# Patient Record
Sex: Female | Born: 1940 | Race: White | Hispanic: No | Smoking: Never smoker
Health system: Southern US, Community
[De-identification: ages and names within clinical notes are randomized; demographics above are authoritative.]

## PROBLEM LIST (undated history)

## (undated) DIAGNOSIS — K219 Gastro-esophageal reflux disease without esophagitis: Secondary | ICD-10-CM

## (undated) DIAGNOSIS — M545 Low back pain, unspecified: Secondary | ICD-10-CM

## (undated) DIAGNOSIS — G8929 Other chronic pain: Secondary | ICD-10-CM

## (undated) DIAGNOSIS — D509 Iron deficiency anemia, unspecified: Secondary | ICD-10-CM

## (undated) DIAGNOSIS — F419 Anxiety disorder, unspecified: Secondary | ICD-10-CM

## (undated) DIAGNOSIS — M1990 Unspecified osteoarthritis, unspecified site: Secondary | ICD-10-CM

## (undated) DIAGNOSIS — I219 Acute myocardial infarction, unspecified: Secondary | ICD-10-CM

## (undated) DIAGNOSIS — K8020 Calculus of gallbladder without cholecystitis without obstruction: Secondary | ICD-10-CM

## (undated) DIAGNOSIS — E119 Type 2 diabetes mellitus without complications: Secondary | ICD-10-CM

## (undated) DIAGNOSIS — N189 Chronic kidney disease, unspecified: Secondary | ICD-10-CM

## (undated) DIAGNOSIS — C44320 Squamous cell carcinoma of skin of unspecified parts of face: Secondary | ICD-10-CM

## (undated) DIAGNOSIS — K7460 Unspecified cirrhosis of liver: Secondary | ICD-10-CM

## (undated) DIAGNOSIS — F32A Depression, unspecified: Secondary | ICD-10-CM

## (undated) DIAGNOSIS — I10 Essential (primary) hypertension: Secondary | ICD-10-CM

## (undated) DIAGNOSIS — J189 Pneumonia, unspecified organism: Secondary | ICD-10-CM

## (undated) DIAGNOSIS — N83201 Unspecified ovarian cyst, right side: Secondary | ICD-10-CM

## (undated) DIAGNOSIS — F329 Major depressive disorder, single episode, unspecified: Secondary | ICD-10-CM

## (undated) DIAGNOSIS — D649 Anemia, unspecified: Secondary | ICD-10-CM

## (undated) DIAGNOSIS — E785 Hyperlipidemia, unspecified: Secondary | ICD-10-CM

## (undated) HISTORY — PX: TUMOR EXCISION: SHX421

## (undated) HISTORY — PX: CATARACT EXTRACTION, BILATERAL: SHX1313

## (undated) HISTORY — DX: Squamous cell carcinoma of skin of unspecified parts of face: C44.320

## (undated) HISTORY — DX: Anemia, unspecified: D64.9

## (undated) HISTORY — PX: FRACTURE SURGERY: SHX138

## (undated) HISTORY — PX: TONSILLECTOMY: SUR1361

## (undated) HISTORY — PX: ESOPHAGOGASTRODUODENOSCOPY: SHX1529

## (undated) HISTORY — PX: COLONOSCOPY: SHX174

## (undated) HISTORY — DX: Unspecified ovarian cyst, right side: N83.201

## (undated) HISTORY — PX: BUNIONECTOMY WITH HAMMERTOE RECONSTRUCTION: SHX5600

## (undated) HISTORY — DX: Unspecified cirrhosis of liver: K74.60

## (undated) HISTORY — PX: DILATION AND CURETTAGE OF UTERUS: SHX78

## (undated) HISTORY — PX: EYE SURGERY: SHX253

## (undated) HISTORY — DX: Calculus of gallbladder without cholecystitis without obstruction: K80.20

---

## 2011-08-12 ENCOUNTER — Encounter: Payer: Self-pay | Admitting: *Deleted

## 2011-08-12 ENCOUNTER — Emergency Department (INDEPENDENT_AMBULATORY_CARE_PROVIDER_SITE_OTHER)
Admission: EM | Admit: 2011-08-12 | Discharge: 2011-08-12 | Disposition: A | Discharge status: Home / Self Care | Payer: Medicare Other | Source: Home / Self Care | Attending: Emergency Medicine | Admitting: Emergency Medicine

## 2011-08-12 DIAGNOSIS — R05 Cough: Secondary | ICD-10-CM

## 2011-08-12 DIAGNOSIS — J209 Acute bronchitis, unspecified: Secondary | ICD-10-CM

## 2011-08-12 DIAGNOSIS — R059 Cough, unspecified: Secondary | ICD-10-CM

## 2011-08-12 HISTORY — DX: Hyperlipidemia, unspecified: E78.5

## 2011-08-12 HISTORY — DX: Essential (primary) hypertension: I10

## 2011-08-12 MED ORDER — AZITHROMYCIN 250 MG PO TABS: ORAL_TABLET | ORAL | Status: AC

## 2011-08-12 MED ORDER — METHYLPREDNISOLONE ACETATE PF 40 MG/ML IJ SUSP
40.0000 mg | Freq: Once | INTRAMUSCULAR | Status: AC
  Administered 2011-08-12: 40 mg via INTRAMUSCULAR

## 2011-08-12 MED ORDER — GUAIFENESIN-CODEINE 100-10 MG/5ML PO SYRP: 5.0000 mL | ORAL_SOLUTION | Freq: Four times a day (QID) | ORAL | Status: AC | PRN

## 2011-08-12 NOTE — ED Provider Notes (Signed)
History     CSN: [id number]  Arrival date & time 08/12/11  1334   First MD Initiated Contact with Patient 08/12/11 1353      Chief Complaint  Patient presents with  . Sore Throat  . Cough    (Consider location/radiation/quality/duration/timing/severity/associated sxs/prior treatment) HPI Amber Gentry is a 71 y.o. female who complains of onset of cold symptoms for 3-4 days.  No sore throat + cough No pleuritic pain + wheezing + nasal congestion + post-nasal drainage No sinus pain/pressure + chest congestion No itchy/red eyes No earache No hemoptysis No SOB No chills/sweats No fever No nausea No vomiting No abdominal pain No diarrhea No skin rashes No fatigue No myalgias No headache    No past medical history on file.  No past surgical history on file.  No family history on file.  History  Substance Use Topics  . Smoking status: Not on file  . Smokeless tobacco: Not on file  . Alcohol Use: Not on file    OB History    No data available      Review of Systems  Allergies  Review of patient's allergies indicates no known allergies.  Home Medications   Current Outpatient Rx  Name Route Sig Dispense Refill  . AZITHROMYCIN 250 MG PO TABS  Use as directed 1 each 0  . GUAIFENESIN-CODEINE 100-10 MG/5ML PO SYRP Oral Take 5 mLs by mouth 4 (four) times daily as needed for cough or congestion. 120 mL 0    BP 139/77  Pulse 72  Temp(Src) 98.2 F (36.8 C) (Oral)  Resp 18  Ht 5' 5.5" (1.664 m)  Wt 173 lb 4 oz (78.586 kg)  BMI 28.39 kg/m2  SpO2 95%  Physical Exam  Nursing note and vitals reviewed. Constitutional: She is oriented to person, place, and time. She appears well-developed and well-nourished.  HENT:  Head: Normocephalic and atraumatic.  Nose: Rhinorrhea present.  Mouth/Throat: No oropharyngeal exudate, posterior oropharyngeal edema or posterior oropharyngeal erythema.       Cerumen bilaterally, clear postnasal drip in her oropharynx  Eyes: No  scleral icterus.  Neck: Neck supple.  Cardiovascular: Regular rhythm and normal heart sounds.   Pulmonary/Chest: Effort normal and breath sounds normal. No respiratory distress.  Neurological: She is alert and oriented to person, place, and time.  Skin: Skin is warm and dry.  Psychiatric: She has a normal mood and affect. Her speech is normal.    ED Course  Procedures (including critical care time)  Labs Reviewed - No data to display No results found.   1. Acute bronchitis   2. Cough       MDM  1)  Take the prescribed antibiotic as instructed.  The patient requested to have a shot to make her feel better quickly, so I agreed in gave her a shot of Depo-Medrol 40 mg today. I also put her on a Z-Pak and gave her cough medicine. I think this is still likely viral, however with her history of pneumonia rather treat her now. If necessary her primary care doctor can see her later this week to make sure she is getting better. 2)  Use nasal saline solution (over the counter) at least 3 times a day. 3)  Use over the counter decongestants like Zyrtec-D every 12 hours as needed to help with congestion.  If you have hypertension, do not take medicines with sudafed.  4)  Can take tylenol every 6 hours or motrin every 8 hours for pain or fever. 5)  Follow up with your primary doctor if no improvement in 5-7 days, sooner if increasing pain, fever, or new symptoms.        Lily Kocher, MD 08/12/11 [phone]

## 2011-08-12 NOTE — ED Notes (Signed)
Pt c/o sore throat, productive cough and and chest congestion x 1 1/2 wk. She has hx of pneumonia.

## 2013-04-11 ENCOUNTER — Emergency Department (INDEPENDENT_AMBULATORY_CARE_PROVIDER_SITE_OTHER)
Admission: EM | Admit: 2013-04-11 | Discharge: 2013-04-11 | Disposition: A | Discharge status: Home / Self Care | Payer: Medicare Other | Source: Home / Self Care | Attending: Family Medicine | Admitting: Family Medicine

## 2013-04-11 ENCOUNTER — Other Ambulatory Visit: Payer: Self-pay | Admitting: Family Medicine

## 2013-04-11 ENCOUNTER — Encounter: Payer: Self-pay | Admitting: *Deleted

## 2013-04-11 DIAGNOSIS — R062 Wheezing: Secondary | ICD-10-CM

## 2013-04-11 DIAGNOSIS — J40 Bronchitis, not specified as acute or chronic: Secondary | ICD-10-CM

## 2013-04-11 DIAGNOSIS — J069 Acute upper respiratory infection, unspecified: Secondary | ICD-10-CM

## 2013-04-11 LAB — POCT CBC W AUTO DIFF (K'VILLE URGENT CARE)

## 2013-04-11 MED ORDER — METHYLPREDNISOLONE ACETATE 80 MG/ML IJ SUSP
80.0000 mg | Freq: Once | INTRAMUSCULAR | Status: AC
  Administered 2013-04-11: 80 mg via INTRAMUSCULAR

## 2013-04-11 MED ORDER — AZITHROMYCIN 250 MG PO TABS: ORAL_TABLET | ORAL | Status: DC

## 2013-04-11 MED ORDER — HYDROCOD POLST-CHLORPHEN POLST 10-8 MG/5ML PO LQCR: 5.0000 mL | Freq: Two times a day (BID) | ORAL | Status: DC | PRN

## 2013-04-11 NOTE — ED Notes (Signed)
Patient c/o body aches, cough, and nasal congestion x 3 days. Patient has tried OTC Tylenol no relief.

## 2013-04-11 NOTE — ED Provider Notes (Signed)
CSN: [id number]     Arrival date & time 04/11/13  1102 History   First MD Initiated Contact with Patient 04/11/13 1104     Chief Complaint  Patient presents with  . Fever  . Generalized Body Aches  . Nasal Congestion    HPI  URI Symptoms Onset: 3-4 days  Description: rhinorrhea, nasal congestion, cough, wheezing  Modifying factors:  none  Symptoms Nasal discharge: yes Fever: subjective at home, not measured Sore throat: yes Cough: yes Wheezing: yes Ear pain: no GI symptoms: no Sick contacts: no  Red Flags  Stiff neck: no Dyspnea: no Rash: no Swallowing difficulty: no  Sinusitis Risk Factors Headache/face pain: no Double sickening: no tooth pain: no  Allergy Risk Factors Sneezing: no Itchy scratchy throat: no Seasonal symptoms: no  Flu Risk Factors Headache: no muscle aches: yes severe fatigue: mild   Past Medical History  Diagnosis Date  . Hypertension   . Diabetes mellitus   . Hyperlipemia    Past Surgical History  Procedure Laterality Date  . Bunionectomy    . 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 Hand surgery      RT    Family History  Problem Relation Age of Onset  . Diabetes Mother    History  Substance Use Topics  . Smoking status: Never Smoker   . Smokeless tobacco: Not on file  . Alcohol Use: No   OB History   Grav Para Term Preterm Abortions TAB SAB Ect Mult Living                 Review of Systems  All other systems reviewed and are negative.    Allergies  Review of patient's allergies indicates no known allergies.  Home Medications   Current Outpatient Rx  Name  Route  Sig  Dispense  Refill  . ALPRAZolam (XANAX) 0.25 MG tablet   Oral   Take 0.25 mg by mouth at bedtime as needed.           . magnesium 30 MG tablet   Oral   Take 30 mg by mouth 2 (two) times daily.           . metFORMIN (GLUCOPHAGE) 1000 MG tablet   Oral   Take 1,000 mg by mouth 2 (two) times daily with a meal.           . metoprolol  (TOPROL-XL) 100 MG 24 hr tablet   Oral   Take 100 mg by mouth daily.           Marland Kitchen omeprazole (PRILOSEC) 20 MG capsule   Oral   Take 20 mg by mouth daily.           . sertraline (ZOLOFT) 100 MG tablet   Oral   Take 100 mg by mouth daily.            There were no vitals taken for this visit. Physical Exam  Constitutional: She appears well-developed and well-nourished.  HENT:  Head: Normocephalic and atraumatic.  Right Ear: External ear normal.  Left Ear: External ear normal.  Mouth/Throat: No oropharyngeal exudate.  +nasal erythema, rhinorrhea bilaterally, + post oropharyngeal erythema    Eyes: Conjunctivae are normal. Pupils are equal, round, and reactive to light.  Neck: Normal range of motion. Neck supple.  Cardiovascular: Normal rate, regular rhythm and normal heart sounds.   Pulmonary/Chest: Effort normal.  Good overall respiratory effort overall clear to auscultation with  faint wheezes in apices.  Abdominal: Soft.  Musculoskeletal: Normal range of motion.  Lymphadenopathy:    She has cervical adenopathy.  Neurological: She is alert.  Skin: Skin is warm.    ED Course  Procedures (including critical care time) Labs Review Labs Reviewed - No data to display Imaging Review No results found.  MDM   1. URI (upper respiratory infection)   2. Bronchitis    Suspect likely viral illness.  Depo-Medrol 80 mg IM x1 for wheezing. Z-Pak for atypical coverage given age and comorbidities. Tussionex for cough. We'll check baseline serologies including flu, RMSF, Lyme. Discuss infectious and respiratory red flags at length with patient. Followup as needed.      The patient and/or caregiver has been counseled thoroughly with regard to treatment plan and/or medications prescribed including dosage, schedule, interactions, rationale for use, and possible side effects and they verbalize understanding. Diagnoses and expected course of recovery discussed and will return if  not improved as expected or if the condition worsens. Patient and/or caregiver verbalized understanding.         Doree Albee, MD 04/11/13 1139

## 2013-04-13 ENCOUNTER — Telehealth: Payer: Self-pay | Admitting: *Deleted

## 2013-04-13 LAB — ROCKY MTN SPOTTED FVR AB, IGG-BLOOD: RMSF IgG: 0.29 IV

## 2013-04-13 NOTE — ED Notes (Unsigned)
[facility] called they were unable to run Influenza PCR test due to the wrong tube was sent to the lab. Do you want to repeat the test or see if the pt has improved first? Per Dr. Alvester Morin:It would be good to have, but if pt is feeling better, no need to repeat.  Called left message to call back to check pts status.

## 2013-04-14 LAB — BORDETELLA PERTUSSIS PCR: B pertussis, DNA: NOT DETECTED

## 2013-04-15 ENCOUNTER — Telehealth: Payer: Self-pay | Admitting: *Deleted

## 2013-04-29 LAB — CULTURE, BORDETELLA PERTUSSIS

## 2013-05-03 ENCOUNTER — Telehealth: Payer: Self-pay | Admitting: *Deleted

## 2014-06-03 ENCOUNTER — Encounter: Payer: Self-pay | Admitting: Emergency Medicine

## 2014-06-03 ENCOUNTER — Emergency Department (INDEPENDENT_AMBULATORY_CARE_PROVIDER_SITE_OTHER)
Admission: EM | Admit: 2014-06-03 | Discharge: 2014-06-03 | Disposition: A | Discharge status: Home / Self Care | Payer: Medicare Other | Source: Home / Self Care | Attending: Emergency Medicine | Admitting: Emergency Medicine

## 2014-06-03 DIAGNOSIS — J0100 Acute maxillary sinusitis, unspecified: Secondary | ICD-10-CM

## 2014-06-03 DIAGNOSIS — J209 Acute bronchitis, unspecified: Secondary | ICD-10-CM

## 2014-06-03 HISTORY — DX: Depression, unspecified: F32.A

## 2014-06-03 HISTORY — DX: Anxiety disorder, unspecified: F41.9

## 2014-06-03 HISTORY — DX: Major depressive disorder, single episode, unspecified: F32.9

## 2014-06-03 MED ORDER — AZITHROMYCIN 250 MG PO TABS: ORAL_TABLET | ORAL | Status: DC

## 2014-06-03 MED ORDER — HYDROCOD POLST-CHLORPHEN POLST 10-8 MG/5ML PO LQCR: 5.0000 mL | Freq: Every evening | ORAL | Status: DC | PRN

## 2014-06-03 MED ORDER — CEFTRIAXONE SODIUM 250 MG IJ SOLR
500.0000 mg | Freq: Once | INTRAMUSCULAR | Status: AC
  Administered 2014-06-03: 500 mg via INTRAMUSCULAR

## 2014-06-03 NOTE — ED Notes (Signed)
Reports 2-3 day history of rhinitis, cough, headache, ear pain, facial pain over sinuses and sore throat. No OTCs since last night. Did have Flu vaccination.

## 2014-06-03 NOTE — ED Provider Notes (Signed)
CSN: [id number]     Arrival date & time 06/03/14  1005 History   First MD Initiated Contact with Patient 06/03/14 1049     Chief Complaint  Patient presents with  . Cough  . Headache  . Otalgia  . Nasal Congestion   (Consider location/radiation/quality/duration/timing/severity/associated sxs/prior Treatment) HPI URI HISTORY  Amber Gentry is a 73 y.o. female who complains of onset of cold symptoms for 4 days. Progressively worsening. Have been using over-the-counter treatment which helps a little bit.  No chills/sweats +  Fever  +  Nasal congestion +  Discolored Post-nasal drainage Positive sinus pain/pressure Positive sore throat  +  cough No wheezing Mild chest congestion No hemoptysis No shortness of breath No pleuritic pain  No itchy/red eyes No earache  No nausea No vomiting No abdominal pain No diarrhea  No skin rashes +  Fatigue No myalgias No headache   Past Medical History  Diagnosis Date  . Hypertension   . Diabetes mellitus   . Hyperlipemia   . Anxiety and depression    Past Surgical History  Procedure Laterality Date  . Bunionectomy    . 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 Hand surgery      RT    Family History  Problem Relation Age of Onset  . Diabetes Mother    History  Substance Use Topics  . Smoking status: Never Smoker   . Smokeless tobacco: Not on file  . Alcohol Use: No   OB History   Grav Para Term Preterm Abortions TAB SAB Ect Mult Living                 Review of Systems  All other systems reviewed and are negative.   Allergies  Review of patient's allergies indicates no known allergies.  Home Medications   Prior to Admission medications   Medication Sig Start Date End Date Taking? Authorizing Provider  ALPRAZolam (XANAX) 0.25 MG tablet Take 0.25 mg by mouth at bedtime as needed.      Historical Provider, MD  azithromycin (ZITHROMAX Z-PAK) 250 MG tablet Take 2 tablets on day one, then 1 tablet daily on days 2  through 5 06/03/14   Jacqulyn Cane, MD  chlorpheniramine-HYDROcodone [facility]) 10-8 MG/5ML [facility] Take 5 mLs by mouth at bedtime as needed for cough. For cough. 06/03/14   Jacqulyn Cane, MD  magnesium 30 MG tablet Take 30 mg by mouth 2 (two) times daily.      Historical Provider, MD  metFORMIN (GLUCOPHAGE) 1000 MG tablet Take 1,000 mg by mouth 2 (two) times daily with a meal.      Historical Provider, MD  metoprolol (TOPROL-XL) 100 MG 24 hr tablet Take 100 mg by mouth daily.      Historical Provider, MD  omeprazole (PRILOSEC) 20 MG capsule Take 20 mg by mouth daily.      Historical Provider, MD  sertraline (ZOLOFT) 100 MG tablet Take 100 mg by mouth daily.      Historical Provider, MD   BP 132/78  Pulse 92  Temp(Src) 97.9 F (36.6 C) (Oral)  Resp 16  Ht 5\' 5"  (1.651 m)  Wt 170 lb (77.111 kg)  BMI 28.29 kg/m2  SpO2 96% Physical Exam  Nursing note and vitals reviewed. Constitutional: She is oriented to person, place, and time. She appears well-developed and well-nourished. No distress.  HENT:  Head: Normocephalic and atraumatic.  Right Ear: Tympanic membrane, external ear and  ear canal normal.  Left Ear: Tympanic membrane, external ear and ear canal normal.  Nose: Mucosal edema and rhinorrhea present. Right sinus exhibits maxillary sinus tenderness. Left sinus exhibits maxillary sinus tenderness.  Mouth/Throat: Oropharynx is clear and moist. No oral lesions. No oropharyngeal exudate.  Eyes: Right eye exhibits no discharge. Left eye exhibits no discharge. No scleral icterus.  Neck: Neck supple.  Cardiovascular: Normal rate, regular rhythm and normal heart sounds.   Pulmonary/Chest: Effort normal. She has no wheezes. She has rhonchi. She has no rales.  Lymphadenopathy:    She has no cervical adenopathy.  Neurological: She is alert and oriented to person, place, and time.  Skin: Skin is warm and dry.  Psychiatric: She has a normal mood and affect.    ED Course   Procedures (including critical care time) Labs Review Labs Reviewed - No data to display  Imaging Review No results found.   MDM   1. Acute maxillary sinusitis, recurrence not specified   2. Acute bronchitis, unspecified organism    Treatment options discussed, as well as risks, benefits, alternatives. Patient voiced understanding and agreement with the following plans:  She requests a shot, Rocephin 500 IM stat given. Z-Pak Tussionex, 1 teaspoon at bedtime if needed. Precautions discussed. Other symptomatic care discussed Follow-up with your primary care doctor in 5-7 days if not improving, or sooner if symptoms become worse. Precautions discussed. Red flags discussed. Questions invited and answered. Patient voiced understanding and agreement.     Jacqulyn Cane, MD 06/03/14 [phone]

## 2015-08-06 HISTORY — PX: PERCUTANEOUS PINNING PHALANX FRACTURE OF HAND: SUR1027

## 2016-06-19 ENCOUNTER — Emergency Department (INDEPENDENT_AMBULATORY_CARE_PROVIDER_SITE_OTHER)
Admission: EM | Admit: 2016-06-19 | Discharge: 2016-06-19 | Disposition: A | Discharge status: Home / Self Care | Payer: Medicare Other | Source: Home / Self Care | Attending: Family Medicine | Admitting: Family Medicine

## 2016-06-19 ENCOUNTER — Ambulatory Visit (INDEPENDENT_AMBULATORY_CARE_PROVIDER_SITE_OTHER): Payer: Medicare Other | Admitting: Family Medicine

## 2016-06-19 ENCOUNTER — Encounter: Payer: Self-pay | Admitting: *Deleted

## 2016-06-19 DIAGNOSIS — M7062 Trochanteric bursitis, left hip: Secondary | ICD-10-CM | POA: Diagnosis not present

## 2016-06-19 DIAGNOSIS — G8929 Other chronic pain: Secondary | ICD-10-CM

## 2016-06-19 DIAGNOSIS — M7060 Trochanteric bursitis, unspecified hip: Secondary | ICD-10-CM | POA: Insufficient documentation

## 2016-06-19 DIAGNOSIS — M545 Low back pain, unspecified: Secondary | ICD-10-CM

## 2016-06-19 DIAGNOSIS — S39012A Strain of muscle, fascia and tendon of lower back, initial encounter: Secondary | ICD-10-CM | POA: Diagnosis not present

## 2016-06-19 DIAGNOSIS — M7061 Trochanteric bursitis, right hip: Secondary | ICD-10-CM

## 2016-06-19 MED ORDER — DICLOFENAC SODIUM 1 % TD GEL: 4.0000 g | Freq: Four times a day (QID) | TRANSDERMAL | 11 refills | Status: DC

## 2016-06-19 NOTE — ED Triage Notes (Signed)
Pt c/o LT side back pain radiating to her LT buttock. She reports a fall in 5/17' with normal xrays. She received an injection of Toradol 30mg  at her PCP in 9/17' with relief. She reports a hx of arthritis. She takes mobic daily.

## 2016-06-19 NOTE — Progress Notes (Signed)
Amber Gentry is a 75 y.o. female who presents to [facility] today for back pain. Patient notes a several day history of left low back pain. She fell several months ago and has had worsening pain recently. She notes pain in the left low back and left lateral hip. Pain is worse with ambulation and lying on her left side. She cannot take tramadol as it interferes with her Xanax or Tylenol as it causes transaminitis or ibuprofen which interferes with her blood pressure and diabetes. She has tried using warm water soaks which do help. She denies any radiating pain weakness or numbness fevers or chills.    Past Medical History:  Diagnosis Date  . Anxiety and depression   . Diabetes mellitus   . Hyperlipemia   . Hypertension    Past Surgical History:  Procedure Laterality Date  . BACK SURGERY    . BUNIONECTOMY    . DILATION AND CURETTAGE OF UTERUS    . HAND SURGERY     RT   . tumor on back     . 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 Smoking status: Never Smoker  . Smokeless tobacco: Never Used  . Alcohol use No   family history includes Diabetes in her mother; Heart disease in her brother and mother; Stroke in her brother.  ROS:  No headache, visual changes, nausea, vomiting, diarrhea, constipation, dizziness, abdominal pain, skin rash, fevers, chills, night sweats, weight loss, swollen lymph nodes, body aches, joint swelling, muscle aches, chest pain, shortness of breath, mood changes, visual or auditory hallucinations.    Medications: Current Outpatient Prescriptions  Medication Sig Dispense Refill  . ALPRAZolam (XANAX) 0.25 MG tablet Take 0.25 mg by mouth at bedtime as needed.      Marland Kitchen aspirin 81 MG chewable tablet Chew by mouth daily.    . diclofenac sodium (VOLTAREN) 1 % GEL Apply 4 g topically 4 (four) times daily. To affected joint. 100 g 11  . magnesium 30 MG tablet Take 30 mg by mouth 2 (two) times daily.       . meloxicam (MOBIC) 7.5 MG tablet Take 7.5 mg by mouth daily.    . metFORMIN (GLUCOPHAGE) 1000 MG tablet Take 1,000 mg by mouth 2 (two) times daily with a meal.      . metoprolol (TOPROL-XL) 100 MG 24 hr tablet Take 100 mg by mouth daily.      Marland Kitchen omeprazole (PRILOSEC) 20 MG capsule Take 20 mg by mouth daily.      . sertraline (ZOLOFT) 100 MG tablet Take 100 mg by mouth daily.      . simvastatin (ZOCOR) 10 MG tablet Take 10 mg by mouth daily.     No current facility-administered medications for this visit.    Allergies  Allergen Reactions  . Percocet [Oxycodone-Acetaminophen]      Exam:  BP 136/82 (BP Location: Left Arm)   Pulse 70   Temp 98 F (36.7 C) (Oral)   Resp 16   Ht 5' 5.5" (1.664 m)   Wt 175 lb (79.4 kg)   SpO2 97%   BMI 28.68 kg/m  General: Well Developed, well nourished, and in no acute distress.  Neuro/Psych: Alert and oriented x3, extra-ocular muscles intact, able to move all 4 extremities, sensation grossly intact. Skin: Warm and dry, no rashes noted.  Respiratory: Not using accessory muscles, speaking in full sentences, trachea midline.  Cardiovascular: Pulses palpable,  no extremity edema. Abdomen: Does not appear distended. MSK: L spine: Nontender to midline. Tender palpation left lumbar paraspinal muscles and SI joint. Normal back motion. Left hip normal-appearing normal motion tenderness palpation left lateral greater trochanter area. Antalgic gait.  XR Lumbar Spine Ap And Lateral5/25/2017 [facility] Result Impression  IMPRESSION: 1. Multilevel degenerative changes as noted above without evidence of acute fracture or dislocation. Mild levoscoliosis of the lumbar spine.  Result Narrative  INDICATION: Fall, low back pain  COMPARISON: None  TECHNIQUE: AP and lateral views of the lumbar spine and a cone-down view of L5/S1 on 3 films  FINDINGS: No evidence of acute fracture or dislocation. Mild levoscoliosis. Vertebral body heights are preserved.  Severe disc space narrowing at L1/L2, L2/L3, L4/L5, and L5/S1. 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 Atherosclerotic  aorta.     No results found for this or any previous visit (from the past 48 hour(s)). No results found.    Assessment and Plan: 75 y.o. female with left low back pain and lateral hip pain. Likely due to lumbosacral strain and trochanteric bursitis. X-rays reviewed showing no fractures. 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 Plan for physical therapy and follow-up in sports medicine clinic in a few weeks. Treat pain with diclofenac gel topically as patient cannot tolerate oral NSAIDs acetaminophen or opiates.     Orders Placed This Encounter  Procedures  . Ambulatory referral to Physical Therapy    Referral Priority:   Routine    Referral Type:   Physical Medicine    Referral Reason:   Specialty Services Required    Requested Specialty:   Physical Therapy    Number of Visits Requested:   1    Discussed warning signs or symptoms. Please see discharge instructions. Patient expresses understanding.

## 2016-06-19 NOTE — Patient Instructions (Signed)
Thank you for coming in today. Use voltaren gel as needed for pain.  Attend PT.  Return to recheck with me in 2-4 weeks.  Follow up with me Dr [facility] Address: [address] [place], [place] [address] Phone: [phone]   Lumbosacral Strain Lumbosacral strain is an injury that causes pain in the lower back (lumbosacral spine). This injury usually occurs from overstretching the muscles or ligaments along your spine. A strain can affect one or more muscles or cord-like tissues that connect bones to other bones (ligaments). What are the causes? This condition may be caused by:  A hard, direct hit (blow) to the back.  Excessive stretching of the lower back muscles. This may result from:  A fall.  Lifting something heavy.  Repetitive movements such as bending or crouching. What increases the risk? The following factors may increase your risk of getting this condition:  Participating in sports or activities that involve:  A sudden twist of the back.  Pushing or pulling motions.  Being overweight or obese.  Having poor strength and flexibility, especially tight hamstrings or weak muscles in the back or abdomen.  Having too much of a curve in the lower back.  Having a pelvis that is tilted forward. What are the signs or symptoms? The main symptom of this condition is pain in the lower back, at the site of the strain. Pain may extend (radiate) down one or both legs. How is this diagnosed? This condition is diagnosed based on:  Your symptoms.  Your medical history.  A physical exam.  Your health care provider may push on certain areas of your back to determine the source of your pain.  You may be asked to bend forward, backward, and side to side to assess the severity of your pain and your range of motion.  Imaging tests, such as:  X-rays.  MRI. How is this treated? Treatment for this condition may include:  Putting heat and cold on  the affected area.  Medicines to help relieve pain and relax your muscles (muscle relaxants).  NSAIDs to help reduce swelling and discomfort. When your symptoms improve, it is important to gradually return to your normal routine as soon as possible to reduce pain, avoid stiffness, and avoid loss of muscle strength. Generally, symptoms should improve within 6 weeks of treatment. However, recovery time varies. Follow these instructions at home: Managing pain, stiffness, and swelling  If directed, put ice on the injured area during the first 24 hours after your strain.  Put ice in a plastic bag.  Place a towel between your skin and the bag.  Leave the ice on for 20 minutes, 2-3 times a day.  If directed, put heat on the affected area as often as told by your health care provider. Use the heat source that your health care provider recommends, such as a moist heat pack or a heating pad.  Place a towel between your skin and the heat source.  Leave the heat on for 20-30 minutes.  Remove the heat if your skin turns bright red. This is especially important if you are unable to feel pain, heat, or cold. You may have a greater risk of getting burned. Activity  Rest and return to your normal activities as told by your health care provider. Ask your health care provider what activities are safe for you.  Avoid activities that take a lot of energy for as long as told by your health care provider.  General instructions  Take over-the-counter and prescription medicines only as told by your health care provider.  Donot drive or use heavy machinery while taking prescription pain medicine.  Do not use any products that contain nicotine or tobacco, such as cigarettes and e-cigarettes. If you need help quitting, ask your health care provider.  Keep all follow-up visits as told by your health care provider. This is important. How is this prevented?  Use correct form when playing sports and lifting  heavy objects.  Use good posture when sitting and standing.  Maintain a healthy weight.  Sleep on a mattress with medium firmness to support your back.  Be safe and responsible while being active to avoid falls.  Do at least 150 minutes of moderate-intensity exercise each week, such as brisk walking or water aerobics. Try a form of exercise that takes stress off your back, such as swimming or stationary cycling.  Maintain physical fitness, including:  Strength.  Flexibility.  Cardiovascular fitness.  Endurance. Contact a health care provider if:  Your back pain does not improve after 6 weeks of treatment.  Your symptoms get worse. Get help right away if:  Your back pain is severe.  You cannot stand or walk.  You have difficulty controlling when you urinate or when you have a bowel movement.  You feel nauseous or you vomit.  Your feet get very cold.  You have numbness, tingling, weakness, or problems using your arms or legs.  You develop any of the following:  Shortness of breath.  Dizziness.  Pain in your legs.  Weakness in your buttocks or legs.  Discoloration of the skin on your toes or legs. This information is not intended to replace advice given to you by your health care provider. Make sure you discuss any questions you have with your health care provider. Document Released: 05/01/2005 Document Revised: 02/09/2016 Document Reviewed: 12/24/2015 Elsevier Interactive Patient Education  2017 [employer].

## 2016-06-19 NOTE — ED Provider Notes (Signed)
Amber Gentry CARE    CSN: [id number] Arrival date & time: 06/19/16  1344     History   Chief Complaint Chief Complaint  Patient presents with  . Back Pain    HPI Amber Gentry is a 75 y.o. female.   Patient complains of 3 to 4 day history of lower back pain, primarily on the left, radiating to her left buttock.  She denies recent trauma or changes in daily activities. She had fallen in May, 2017, and was evaluated at [facility] ED where x-ray of her lumbar spine showed no acute changes.  She also had negative x-rays of her thoracic spine and negative CT head.  Her pain is worse with ambulation and supine on her left side.   She denies bowel or bladder dysfunction, and no saddle numbness.  She is unable to take Tramadol, Tylenol, or ibuprofen.  Her pain does not respond to warm soaks.        Back Pain  Location:  Lumbar spine and sacro-iliac joint Quality:  Aching Radiates to: left buttock. Pain severity:  Moderate Pain is:  Same all the time Onset quality:  Gradual Duration:  4 days Timing:  Constant Progression:  Unchanged Chronicity:  Recurrent Context comment:  Previous fall Relieved by:  Nothing Worsened by:  Ambulation Ineffective treatments: warm soaks. Associated symptoms: no abdominal pain, no bladder incontinence, no bowel incontinence, no chest pain, no dysuria, no fever, no leg pain, no numbness, no paresthesias, no pelvic pain, no perianal numbness, no tingling, no weakness and no weight loss     Past Medical History:  Diagnosis Date  . Anxiety and depression   . Diabetes mellitus   . Hyperlipemia   . Hypertension     Patient Active Problem List   Diagnosis Date Noted  . Lumbosacral strain 06/19/2016  . Trochanteric bursitis 06/19/2016    Past Surgical History:  Procedure Laterality Date  . BACK SURGERY    . BUNIONECTOMY    . DILATION AND CURETTAGE OF UTERUS    . HAND SURGERY     RT   . tumor on back     . 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 Authorizing Provider  ALPRAZolam (XANAX) 0.25 MG tablet Take 0.25 mg by mouth at bedtime as needed.     Yes Historical Provider, MD  aspirin 81 MG chewable tablet Chew by mouth daily.   Yes Historical Provider, MD  meloxicam (MOBIC) 7.5 MG tablet Take 7.5 mg by mouth daily.   Yes Historical Provider, MD  metFORMIN (GLUCOPHAGE) 1000 MG tablet Take 1,000 mg by mouth 2 (two) times daily with a meal.     Yes Historical Provider, MD  metoprolol (TOPROL-XL) 100 MG 24 hr tablet Take 100 mg by mouth daily.     Yes Historical Provider, MD  sertraline (ZOLOFT) 100 MG tablet Take 100 mg by mouth daily.     Yes Historical Provider, MD  simvastatin (ZOCOR) 10 MG tablet Take 10 mg by mouth daily.   Yes Historical Provider, MD  diclofenac sodium (VOLTAREN) 1 % GEL Apply 4 g topically 4 (four) times daily. To affected joint. 06/19/16   Gregor Hams, MD  magnesium 30 MG tablet Take 30 mg by mouth 2 (two) times daily.  Historical Provider, MD  omeprazole (PRILOSEC) 20 MG capsule Take 20 mg by mouth daily.      Historical Provider, MD    Family History Family History  Problem Relation Age of Onset  . Diabetes Mother   . Heart disease Mother   . Heart disease Brother   . Stroke Brother     Social History Social History  Substance Use Topics  . Smoking status: Never Smoker  . Smokeless tobacco: Never Used  . Alcohol use No     Allergies   Percocet [oxycodone-acetaminophen]   Review of Systems Review of Systems  Constitutional: Negative for fever and weight loss.  Cardiovascular: Negative for chest pain.  Gastrointestinal: Negative for abdominal pain and bowel incontinence.  Genitourinary: Negative for bladder incontinence, dysuria and pelvic pain.  Musculoskeletal: Positive for back pain.  Neurological: Negative for tingling,  weakness, numbness and paresthesias.  All other systems reviewed and are negative.    Physical Exam Triage Vital Signs ED Triage Vitals  Enc Vitals Group     BP 06/19/16 1500 136/82     Pulse Rate 06/19/16 1500 70     Resp 06/19/16 1500 16     Temp 06/19/16 1500 98 F (36.7 C)     Temp Source 06/19/16 1500 Oral     SpO2 06/19/16 1500 97 %     Weight 06/19/16 1501 175 lb (79.4 kg)     Height 06/19/16 1501 5' 5.5" (1.664 m)     Head Circumference --      Peak Flow --      Pain Score 06/19/16 1505 8     Pain Loc --      Pain Edu? --      Excl. in [place]? --    No data found.   Updated Vital Signs BP 136/82 (BP Location: Left Arm)   Pulse 70   Temp 98 F (36.7 C) (Oral)   Resp 16   Ht 5' 5.5" (1.664 m)   Wt 175 lb (79.4 kg)   SpO2 97%   BMI 28.68 kg/m   Visual Acuity Right Eye Distance:   Left Eye Distance:   Bilateral Distance:    Right Eye Near:   Left Eye Near:    Bilateral Near:     Physical Exam  Constitutional: She appears well-developed and well-nourished. No distress.  HENT:  Head: Atraumatic.  Nose: Nose normal.  Mouth/Throat: Oropharynx is clear and moist.  Eyes: Conjunctivae are normal. Pupils are equal, round, and reactive to light.  Neck: Normal range of motion.  Cardiovascular: Normal heart sounds.   Pulmonary/Chest: Breath sounds normal.  Abdominal: Soft. There is no tenderness.  Musculoskeletal: She exhibits no edema.       Back:       Legs: Back:  Can heel/toe walk and squat without difficulty.  Decreased range of motion.  Tenderness in the midline and bilateral paraspinous muscles as noted on diagram.  There is tenderness to palpation primarily over the left SI joint.  Straight leg raising test is negative.  Sitting knee extension test is negative.  Strength and sensation in the lower extremities is normal.  Patellar and achilles reflexes are normal. There is bilateral tenderness to palpation over both greater trochanters, worse on the  left.   Neurological: She is alert.  Skin: Skin is warm and dry. No rash noted.  Nursing note and vitals reviewed.    UC Treatments / Results  Labs (all labs ordered are listed, but only abnormal  results are displayed) Labs Reviewed - No data to display  EKG  EKG Interpretation None       Radiology No results found.  Procedures Procedures (including critical care time)  Medications Ordered in UC Medications - No data to display   Initial Impression / Assessment and Plan / UC Course  I have reviewed the triage vital signs and the nursing notes.  Pertinent labs & imaging results that were available during my care of the patient were reviewed by me and considered in my medical decision making (see chart for details).  Clinical Course   Will refer to Dr. Lynne Leader for further management and follow-up.    Final Clinical Impressions(s) / UC Diagnoses   Final diagnoses:  Chronic bilateral low back pain without sciatica  Trochanteric bursitis of both hips    New Prescriptions Discharge Medication List as of 06/19/2016  4:08 PM    START taking these medications   Details  diclofenac sodium (VOLTAREN) 1 % GEL Apply 4 g topically 4 (four) times daily. To affected joint., Starting Wed 06/19/2016, Normal         Kandra Nicolas, MD 06/22/16 1422

## 2016-06-20 ENCOUNTER — Telehealth: Payer: Self-pay | Admitting: *Deleted

## 2016-06-20 NOTE — Telephone Encounter (Signed)
Called and left message on patients vm that Diclofenac is $30 and may find coupon on [email or web address]

## 2016-06-25 ENCOUNTER — Ambulatory Visit: Payer: Medicare Other | Admitting: Physical Therapy

## 2018-03-13 ENCOUNTER — Other Ambulatory Visit: Payer: Self-pay

## 2018-03-13 ENCOUNTER — Inpatient Hospital Stay (HOSPITAL_COMMUNITY)
Admission: EM | Admit: 2018-03-13 | Discharge: 2018-03-16 | DRG: 603 | Disposition: A | Discharge status: Home / Self Care | Payer: Medicare Other | Attending: Family Medicine | Admitting: Family Medicine

## 2018-03-13 ENCOUNTER — Emergency Department (HOSPITAL_COMMUNITY): Payer: Medicare Other

## 2018-03-13 ENCOUNTER — Encounter (HOSPITAL_COMMUNITY): Payer: Self-pay | Admitting: *Deleted

## 2018-03-13 DIAGNOSIS — N179 Acute kidney failure, unspecified: Secondary | ICD-10-CM | POA: Diagnosis not present

## 2018-03-13 DIAGNOSIS — L0231 Cutaneous abscess of buttock: Principal | ICD-10-CM | POA: Diagnosis present

## 2018-03-13 DIAGNOSIS — E785 Hyperlipidemia, unspecified: Secondary | ICD-10-CM | POA: Diagnosis present

## 2018-03-13 DIAGNOSIS — L03317 Cellulitis of buttock: Secondary | ICD-10-CM | POA: Diagnosis present

## 2018-03-13 DIAGNOSIS — D696 Thrombocytopenia, unspecified: Secondary | ICD-10-CM | POA: Diagnosis present

## 2018-03-13 DIAGNOSIS — E1165 Type 2 diabetes mellitus with hyperglycemia: Secondary | ICD-10-CM | POA: Diagnosis present

## 2018-03-13 DIAGNOSIS — Z885 Allergy status to narcotic agent status: Secondary | ICD-10-CM

## 2018-03-13 DIAGNOSIS — D638 Anemia in other chronic diseases classified elsewhere: Secondary | ICD-10-CM | POA: Diagnosis not present

## 2018-03-13 DIAGNOSIS — Z79899 Other long term (current) drug therapy: Secondary | ICD-10-CM

## 2018-03-13 DIAGNOSIS — Z791 Long term (current) use of non-steroidal anti-inflammatories (NSAID): Secondary | ICD-10-CM

## 2018-03-13 DIAGNOSIS — D6489 Other specified anemias: Secondary | ICD-10-CM | POA: Diagnosis present

## 2018-03-13 DIAGNOSIS — I10 Essential (primary) hypertension: Secondary | ICD-10-CM | POA: Diagnosis present

## 2018-03-13 DIAGNOSIS — Z7982 Long term (current) use of aspirin: Secondary | ICD-10-CM

## 2018-03-13 DIAGNOSIS — Z7984 Long term (current) use of oral hypoglycemic drugs: Secondary | ICD-10-CM

## 2018-03-13 DIAGNOSIS — F418 Other specified anxiety disorders: Secondary | ICD-10-CM | POA: Diagnosis present

## 2018-03-13 DIAGNOSIS — IMO0002 Reserved for concepts with insufficient information to code with codable children: Secondary | ICD-10-CM | POA: Diagnosis present

## 2018-03-13 HISTORY — DX: Acute myocardial infarction, unspecified: I21.9

## 2018-03-13 HISTORY — DX: Type 2 diabetes mellitus without complications: E11.9

## 2018-03-13 HISTORY — DX: Low back pain: M54.5

## 2018-03-13 HISTORY — DX: Unspecified osteoarthritis, unspecified site: M19.90

## 2018-03-13 HISTORY — DX: Other chronic pain: G89.29

## 2018-03-13 HISTORY — DX: Low back pain, unspecified: M54.50

## 2018-03-13 HISTORY — DX: Gastro-esophageal reflux disease without esophagitis: K21.9

## 2018-03-13 HISTORY — DX: Pneumonia, unspecified organism: J18.9

## 2018-03-13 HISTORY — DX: Iron deficiency anemia, unspecified: D50.9

## 2018-03-13 LAB — URINALYSIS, ROUTINE W REFLEX MICROSCOPIC
Glucose, UA: 50 mg/dL — AB
Hgb urine dipstick: NEGATIVE
KETONES UR: NEGATIVE mg/dL
Nitrite: NEGATIVE
Protein, ur: 30 mg/dL — AB
SPECIFIC GRAVITY, URINE: 1.024 (ref 1.005–1.030)
pH: 5 (ref 5.0–8.0)

## 2018-03-13 LAB — COMPREHENSIVE METABOLIC PANEL
ALBUMIN: 3.6 g/dL (ref 3.5–5.0)
ALK PHOS: 141 U/L — AB (ref 38–126)
ALT: 32 U/L (ref 0–44)
AST: 34 U/L (ref 15–41)
Anion gap: 14 (ref 5–15)
BILIRUBIN TOTAL: 1.3 mg/dL — AB (ref 0.3–1.2)
BUN: 23 mg/dL (ref 8–23)
CO2: 18 mmol/L — AB (ref 22–32)
CREATININE: 1.59 mg/dL — AB (ref 0.44–1.00)
Calcium: 8.4 mg/dL — ABNORMAL LOW (ref 8.9–10.3)
Chloride: 102 mmol/L (ref 98–111)
GFR calc Af Amer: 35 mL/min — ABNORMAL LOW (ref >60)
GFR calc non Af Amer: 30 mL/min — ABNORMAL LOW (ref >60)
GLUCOSE: 334 mg/dL — AB (ref 70–99)
Potassium: 4 mmol/L (ref 3.5–5.1)
SODIUM: 134 mmol/L — AB (ref 135–145)
TOTAL PROTEIN: 6.9 g/dL (ref 6.5–8.1)

## 2018-03-13 LAB — CBC WITH DIFFERENTIAL/PLATELET
BASOS PCT: 0 %
Basophils Absolute: 0 10*3/uL (ref 0.0–0.1)
EOS ABS: 0.1 10*3/uL (ref 0.0–0.7)
Eosinophils Relative: 1 %
HCT: 36 % (ref 36.0–46.0)
HEMOGLOBIN: 11.5 g/dL — AB (ref 12.0–15.0)
LYMPHS ABS: 1.6 10*3/uL (ref 0.7–4.0)
Lymphocytes Relative: 13 %
MCH: 29.7 pg (ref 26.0–34.0)
MCHC: 31.9 g/dL (ref 30.0–36.0)
MCV: 93 fL (ref 78.0–100.0)
MONO ABS: 0.6 10*3/uL (ref 0.1–1.0)
Monocytes Relative: 5 %
NEUTROS ABS: 10.3 10*3/uL — AB (ref 1.7–7.7)
Neutrophils Relative %: 81 %
PLATELETS: 173 10*3/uL (ref 150–400)
RBC: 3.87 MIL/uL (ref 3.87–5.11)
RDW: 12.7 % (ref 11.5–15.5)
WBC: 12.6 10*3/uL — ABNORMAL HIGH (ref 4.0–10.5)

## 2018-03-13 LAB — I-STAT CG4 LACTIC ACID, ED: Lactic Acid, Venous: 1.42 mmol/L (ref 0.5–1.9)

## 2018-03-13 LAB — GLUCOSE, CAPILLARY: Glucose-Capillary: 156 mg/dL — ABNORMAL HIGH (ref 70–99)

## 2018-03-13 MED ORDER — ACETAMINOPHEN 500 MG PO TABS
1000.0000 mg | ORAL_TABLET | Freq: Once | ORAL | Status: DC
  Filled 2018-03-13: qty 2

## 2018-03-13 MED ORDER — SODIUM CHLORIDE 0.9 % IV BOLUS
1000.0000 mL | Freq: Once | INTRAVENOUS | Status: AC
  Administered 2018-03-13: 1000 mL via INTRAVENOUS

## 2018-03-13 MED ORDER — ONDANSETRON HCL 4 MG PO TABS
4.0000 mg | ORAL_TABLET | Freq: Four times a day (QID) | ORAL | Status: DC | PRN
  Administered 2018-03-15: 4 mg via ORAL
  Filled 2018-03-13: qty 1

## 2018-03-13 MED ORDER — ONDANSETRON HCL 4 MG/2ML IJ SOLN
4.0000 mg | Freq: Four times a day (QID) | INTRAMUSCULAR | Status: DC | PRN
  Administered 2018-03-14 (×3): 4 mg via INTRAVENOUS
  Filled 2018-03-13 (×3): qty 2

## 2018-03-13 MED ORDER — INSULIN ASPART 100 UNIT/ML ~~LOC~~ SOLN
0.0000 [IU] | Freq: Three times a day (TID) | SUBCUTANEOUS | Status: DC
  Administered 2018-03-14 – 2018-03-15 (×4): 2 [IU] via SUBCUTANEOUS
  Administered 2018-03-15 – 2018-03-16 (×2): 3 [IU] via SUBCUTANEOUS

## 2018-03-13 MED ORDER — ENOXAPARIN SODIUM 40 MG/0.4ML ~~LOC~~ SOLN
40.0000 mg | SUBCUTANEOUS | Status: DC
  Administered 2018-03-13 – 2018-03-15 (×3): 40 mg via SUBCUTANEOUS
  Filled 2018-03-13 (×3): qty 0.4

## 2018-03-13 MED ORDER — CLINDAMYCIN PHOSPHATE 900 MG/50ML IV SOLN
900.0000 mg | Freq: Once | INTRAVENOUS | Status: AC
  Administered 2018-03-13: 900 mg via INTRAVENOUS
  Filled 2018-03-13: qty 50

## 2018-03-13 MED ORDER — ACETAMINOPHEN 650 MG RE SUPP: 650.0000 mg | Freq: Four times a day (QID) | RECTAL | Status: DC | PRN

## 2018-03-13 MED ORDER — ACETAMINOPHEN 325 MG PO TABS
650.0000 mg | ORAL_TABLET | Freq: Four times a day (QID) | ORAL | Status: DC | PRN
  Filled 2018-03-13: qty 2

## 2018-03-13 MED ORDER — IBUPROFEN 200 MG PO TABS
800.0000 mg | ORAL_TABLET | Freq: Once | ORAL | Status: DC
Start: 2018-03-13 — End: 2018-03-14
  Filled 2018-03-13: qty 1

## 2018-03-13 MED ORDER — CLINDAMYCIN PHOSPHATE 600 MG/50ML IV SOLN
600.0000 mg | Freq: Three times a day (TID) | INTRAVENOUS | Status: DC
  Administered 2018-03-13 – 2018-03-16 (×8): 600 mg via INTRAVENOUS
  Filled 2018-03-13 (×9): qty 50

## 2018-03-13 MED ORDER — SODIUM CHLORIDE 0.9 % IV SOLN
INTRAVENOUS | Status: DC
  Administered 2018-03-13 – 2018-03-15 (×2): via INTRAVENOUS

## 2018-03-13 MED ORDER — ONDANSETRON HCL 4 MG/2ML IJ SOLN
4.0000 mg | Freq: Once | INTRAMUSCULAR | Status: AC
  Administered 2018-03-13: 4 mg via INTRAVENOUS
  Filled 2018-03-13: qty 2

## 2018-03-13 MED ORDER — INSULIN ASPART 100 UNIT/ML ~~LOC~~ SOLN: 0.0000 [IU] | Freq: Every day | SUBCUTANEOUS | Status: DC

## 2018-03-13 MED ORDER — MORPHINE SULFATE 15 MG PO TABS
15.0000 mg | ORAL_TABLET | ORAL | Status: DC | PRN
  Administered 2018-03-13 – 2018-03-16 (×10): 15 mg via ORAL
  Filled 2018-03-13 (×10): qty 1

## 2018-03-13 MED ORDER — LIDOCAINE-EPINEPHRINE (PF) 2 %-1:200000 IJ SOLN
20.0000 mL | Freq: Once | INTRAMUSCULAR | Status: AC
  Administered 2018-03-13: 20 mL via INTRADERMAL
  Filled 2018-03-13: qty 20

## 2018-03-13 NOTE — H&P (Addendum)
History and Physical    Amber Gentry [id number] DOB: [birth year] DOA: 03/13/2018  Referring MD/NP/PA: Deno Etienne, MD PCP: Record, [place]  Patient coming from: home  Chief Complaint: Buttock pain  I have personally briefly reviewed patient's old medical records in [place]   HPI: Amber Gentry is a 77 y.o. female with medical history significant of HTN, HLD, and DM type II; who presents with worsening pain and erythema of her right.  Symptoms initially started 3 days ago.  Patient recalls having an area on her right buttock that she had popped a few days prior.  Since that time she had associated symptoms of subjective fever, chills, nausea, vomiting, generalized malaise, and decreased oral intake.Marland Kitchen  She was seen by her primary care doctor 2 days ago, and started on doxycycline and Zofran for symptoms at that time.  He has been taking medications as prescribed, but reported spreading of erythema with increased warmth to the touch.  ED Course: On admission to the emergency department patient was noted to be afebrile with stable vital signs.  Labs revealed WBC 12.6, hemoglobin 11.5, BUN 23, creatinine 1.59, glucose 334, and lactic acid 1.42.  Patient was evaluated and found to have signs of an abscess which I&D was performed by Dr. Tyrone Nine. Blood cultures were obtained.  Patient received 1 L of normal saline IV fluids, 100 mg of clindamycin IV, 800 mg of ibuprofen, 1000 mg of Tylenol, and Zofran.  TRH called to admit.  Review of Systems  Constitutional: Positive for chills, fever and malaise/fatigue.  HENT: Negative for congestion and ear discharge.   Eyes: Negative for photophobia and pain.  Respiratory: Negative for hemoptysis and shortness of breath.   Cardiovascular: Negative for chest pain and leg swelling.  Gastrointestinal: Positive for nausea and vomiting. Negative for abdominal pain.  Genitourinary: Negative for dysuria and frequency.  Musculoskeletal: Positive for myalgias.  Negative for back pain.  Skin:       Positive for skin color change  Neurological: Positive for weakness. Negative for loss of consciousness.  Endo/Heme/Allergies: Negative for polydipsia. Does not bruise/bleed easily.  Psychiatric/Behavioral: Negative for memory loss and substance abuse.    Past Medical History:  Diagnosis Date  . Anxiety and depression   . Diabetes mellitus   . Hyperlipemia   . Hypertension     Past Surgical History:  Procedure Laterality Date  . BACK SURGERY    . BUNIONECTOMY    . DILATION AND CURETTAGE OF UTERUS    . HAND SURGERY     RT   . tumor on back     . 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 She has never used smokeless tobacco. She reports that she does not drink alcohol or use drugs.  Allergies  Allergen Reactions  . Percocet [Oxycodone-Acetaminophen]     Family History  Problem Relation Age of Onset  . Diabetes Mother   . Heart disease Mother   . Heart disease Brother   . Stroke Brother     Prior to Admission medications   Medication Sig Start Date End Date Taking? Authorizing Provider  ALPRAZolam (XANAX) 0.25 MG tablet Take 0.25 mg by mouth at bedtime as needed.      [provider]  aspirin 81 MG chewable tablet Chew by mouth daily.    [provider]  diclofenac sodium (VOLTAREN) 1 % GEL Apply 4 g topically 4 (four) times daily. To affected joint. 06/19/16  Gregor Hams, MD  magnesium 30 MG tablet Take 30 mg by mouth 2 (two) times daily.      [provider]  meloxicam (MOBIC) 7.5 MG tablet Take 7.5 mg by mouth daily.    [provider]  metFORMIN (GLUCOPHAGE) 1000 MG tablet Take 1,000 mg by mouth 2 (two) times daily with a meal.      [provider]  metoprolol (TOPROL-XL) 100 MG 24 hr tablet Take 100 mg by mouth daily.      [provider]  omeprazole (PRILOSEC) 20 MG capsule Take 20 mg by mouth daily.      [provider]  sertraline (ZOLOFT) 100  MG tablet Take 100 mg by mouth daily.      [provider]  simvastatin (ZOCOR) 10 MG tablet Take 10 mg by mouth daily.    [provider]    Physical Exam:  Constitutional: Female who appears sick, but able to follow commands. Vitals:   03/13/18 1119  BP: (!) 142/65  Pulse: 94  Resp: 16  Temp: 98.7 F (37.1 C)  TempSrc: Oral  SpO2: 98%   Eyes: PERRL, lids and conjunctivae normal ENMT: Mucous membranes are dry. Posterior pharynx clear of any exudate or lesions. Normal dentition.  Neck: normal, supple, no masses, no thyromegaly Respiratory: clear to auscultation bilaterally, no wheezing, no crackles. Normal respiratory effort. No accessory muscle use.  Cardiovascular: Regular rate and rhythm, no murmurs / rubs / gallops. No extremity edema. 2+ pedal pulses. No carotid bruits.  Abdomen: no tenderness, no masses palpated. No hepatosplenomegaly. Bowel sounds positive.  Musculoskeletal: no clubbing / cyanosis. No joint deformity upper and lower extremities. Good ROM, no contractures. Normal muscle tone.  Skin: 7 cm area of erythema noted of the right buttock noted. Neurologic: CN 2-12 grossly intact. Sensation intact, DTR normal. Strength 5/5 in all 4.  Psychiatric: Normal judgment and insight. Alert and oriented x 3. Normal mood.     Labs on Admission: I have personally reviewed following labs and imaging studies  CBC: Recent Labs  Lab 03/13/18 1121  WBC 12.6*  NEUTROABS 10.3*  HGB 11.5*  HCT 36.0  MCV 93.0  PLT 818   Basic Metabolic Panel: Recent Labs  Lab 03/13/18 1121  NA 134*  K 4.0  CL 102  CO2 18*  GLUCOSE 334*  BUN 23  CREATININE 1.59*  CALCIUM 8.4*   GFR: CrCl cannot be calculated (Unknown ideal weight.). Liver Function Tests: Recent Labs  Lab 03/13/18 1121  AST 34  ALT 32  ALKPHOS 141*  BILITOT 1.3*  PROT 6.9  ALBUMIN 3.6   No results for input(s): LIPASE, AMYLASE in the last 168 hours. No results for input(s): AMMONIA in  the last 168 hours. Coagulation Profile: No results for input(s): INR, PROTIME in the last 168 hours. Cardiac Enzymes: No results for input(s): CKTOTAL, CKMB, CKMBINDEX, TROPONINI in the last 168 hours. BNP (last 3 results) No results for input(s): PROBNP in the last 8760 hours. HbA1C: No results for input(s): HGBA1C in the last 72 hours. CBG: No results for input(s): GLUCAP in the last 168 hours. Lipid Profile: No results for input(s): CHOL, HDL, LDLCALC, TRIG, CHOLHDL, LDLDIRECT in the last 72 hours. Thyroid Function Tests: No results for input(s): TSH, T4TOTAL, FREET4, T3FREE, THYROIDAB in the last 72 hours. Anemia Panel: No results for input(s): VITAMINB12, FOLATE, FERRITIN, TIBC, IRON, RETICCTPCT in the last 72 hours. Urine analysis:    Component Value Date/Time   COLORURINE AMBER (A)  03/13/2018 1326   APPEARANCEUR CLOUDY (A) 03/13/2018 1326   LABSPEC 1.024 03/13/2018 1326   PHURINE 5.0 03/13/2018 1326   GLUCOSEU 50 (A) 03/13/2018 1326   HGBUR NEGATIVE 03/13/2018 1326   BILIRUBINUR SMALL (A) 03/13/2018 1326   KETONESUR NEGATIVE 03/13/2018 1326   PROTEINUR 30 (A) 03/13/2018 1326   NITRITE NEGATIVE 03/13/2018 1326   LEUKOCYTESUR TRACE (A) 03/13/2018 1326   Sepsis Labs: No results found for this or any previous visit (from the past 240 hour(s)).   Radiological Exams on Admission: Dg Chest 2 View  Result Date: 03/13/2018 CLINICAL DATA:  Fever. EXAM: CHEST - 2 VIEW COMPARISON:  None. FINDINGS: The heart size and mediastinal contours are within normal limits. Both lungs are clear. The visualized skeletal structures are unremarkable. IMPRESSION: No active cardiopulmonary disease. Electronically Signed   By: Marijo Conception, M.D.   On: 03/13/2018 11:59    Chest x-ray: Independently reviewed.  No acute abnormalities noted.  Assessment/Plan Cellulitis and abscess of buttock: Acute.  Patient presented with worsening erythema and swelling of the right buttock despite taking oral  medications of doxycycline.  Patient status post I&D and placed on clindamycin IV. - Admit to a med-surg bed - Follow-up blood cultures - Continue clindamycin IV  Leukocytosis: Acute. WBC elevated at 12.6 on admission.  Suspect related to above. - Check CBC in a.m.  Acute kidney injury: Per review of records on care everywhere baseline creatinine previously noted to be around 1.1.  Patient presents with a creatinine of 1.59 with BUN 23. - IV fluids at 100 mL/h - Recheck creatinine in a.m. - Hold nephrotoxic agents  Diabetes mellitus type 2 with hyperplastic: Uncontrolled.  Patient presents with a blood glucose of 334 on admission currently on oral medications of metformin.  Last hemoglobin A1c noted to be 9 on 03/06/18.  Suspect infection likely increasing blood sugars. - Hypoglycemic protocol - Hold metformin - CBGs q. before meals and at bedtime with moderate SSI  Anemia of chronic disease: Stable per review of records on care everywhere.  Hemoglobin 11.5. - Check CBC in a.m.   DVT prophylaxis: lovenox Code Status: Full  Family Communication: Discussed plan of care with the patient and family present at bedside Disposition Plan: Likely discharge home in 1 to 2 days Consults called: None Admission status: Observation  Norval Morton MD [facility] Pager [phone]   If 7PM-7AM, please contact night-coverage [email or web address] Password [email or web address]  03/13/2018, 3:40 PM

## 2018-03-13 NOTE — ED Provider Notes (Signed)
[facility] EMERGENCY DEPARTMENT Provider Note   CSN: [id number] Arrival date & time: 03/13/18  1111     History   Chief Complaint Chief Complaint  Patient presents with  . Cellulitis    HPI Amber Gentry is a 77 y.o. female.  77 yo F with a cc of redness to the right buttock.  Going on for past week.  Seen by pcp two days ago, thought to be cellulitis and started on doxy and zofran.  Having systemic symptoms.  Keeping down very minimal fluids.    Subjective fevers and chills.    The history is provided by the patient and a relative.  Illness  This is a new problem. The current episode started more than 2 days ago. The problem occurs constantly. The problem has been rapidly worsening. Pertinent negatives include no chest pain, no headaches and no shortness of breath. Exacerbated by: palpation. Nothing relieves the symptoms. She has tried nothing for the symptoms. The treatment provided no relief.    Past Medical History:  Diagnosis Date  . Anxiety and depression   . Diabetes mellitus   . Hyperlipemia   . Hypertension     Patient Active Problem List   Diagnosis Date Noted  . Cellulitis and abscess of buttock 03/13/2018  . Lumbosacral strain 06/19/2016  . Trochanteric bursitis 06/19/2016    Past Surgical History:  Procedure Laterality Date  . BACK SURGERY    . BUNIONECTOMY    . DILATION AND CURETTAGE OF UTERUS    . HAND SURGERY     RT   . tumor on back     . 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 Authorizing Provider  ALPRAZolam (XANAX) 0.25 MG tablet Take 0.25 mg by mouth at bedtime as needed.      [provider]  aspirin 81 MG chewable tablet Chew by mouth daily.    [provider]  diclofenac sodium (VOLTAREN) 1 % GEL Apply 4 g topically 4 (four) times daily. To affected joint. 06/19/16   Gregor Hams, MD    magnesium 30 MG tablet Take 30 mg by mouth 2 (two) times daily.      [provider]  meloxicam (MOBIC) 7.5 MG tablet Take 7.5 mg by mouth daily.    [provider]  metFORMIN (GLUCOPHAGE) 1000 MG tablet Take 1,000 mg by mouth 2 (two) times daily with a meal.      [provider]  metoprolol (TOPROL-XL) 100 MG 24 hr tablet Take 100 mg by mouth daily.      [provider]  omeprazole (PRILOSEC) 20 MG capsule Take 20 mg by mouth daily.      [provider]  sertraline (ZOLOFT) 100 MG tablet Take 100 mg by mouth daily.      [provider]  simvastatin (ZOCOR) 10 MG tablet Take 10 mg by mouth daily.    [provider]    Family History Family History  Problem Relation Age of Onset  . Diabetes Mother   . Heart disease Mother   . Heart disease Brother   . Stroke Brother     Social History Social History   Tobacco Use  . Smoking status: Never Smoker  . Smokeless tobacco: Never Used  Substance Use Topics  . Alcohol use: No  .  Drug use: No     Allergies   Percocet [oxycodone-acetaminophen]   Review of Systems Review of Systems  Constitutional: Positive for chills and fever (subjective).  HENT: Negative for congestion and rhinorrhea.   Eyes: Negative for redness and visual disturbance.  Respiratory: Negative for shortness of breath and wheezing.   Cardiovascular: Negative for chest pain and palpitations.  Gastrointestinal: Negative for nausea and vomiting.  Genitourinary: Negative for dysuria and urgency.  Musculoskeletal: Negative for arthralgias and myalgias.  Skin: Positive for color change and wound. Negative for pallor.  Neurological: Negative for dizziness and headaches.     Physical Exam Updated Vital Signs BP (!) 142/65 (BP Location: Right Arm)   Pulse 94   Temp 98.7 F (37.1 C) (Oral)   Resp 16   SpO2 98%   Physical Exam  Constitutional: She is oriented to person, place, and time. She appears  well-developed and well-nourished. No distress.  HENT:  Head: Normocephalic and atraumatic.  Eyes: Pupils are equal, round, and reactive to light. EOM are normal.  Neck: Normal range of motion. Neck supple.  Cardiovascular: Normal rate and regular rhythm. Exam reveals no gallop and no friction rub.  No murmur heard. Pulmonary/Chest: Effort normal. She has no wheezes. She has no rales.  Abdominal: Soft. She exhibits no distension. There is no tenderness.  Genitourinary:     Musculoskeletal: She exhibits no edema or tenderness.  Neurological: She is alert and oriented to person, place, and time.  Skin: Skin is warm and dry. She is not diaphoretic.  Psychiatric: She has a normal mood and affect. Her behavior is normal.  Nursing note and vitals reviewed.    ED Treatments / Results  Labs (all labs ordered are listed, but only abnormal results are displayed) Labs Reviewed  COMPREHENSIVE METABOLIC PANEL - Abnormal; Notable for the following components:      Result Value   Sodium 134 (*)    CO2 18 (*)    Glucose, Bld 334 (*)    Creatinine, Ser 1.59 (*)    Calcium 8.4 (*)    Alkaline Phosphatase 141 (*)    Total Bilirubin 1.3 (*)    GFR calc non Af Amer 30 (*)    GFR calc Af Amer 35 (*)    All other components within normal limits  CBC WITH DIFFERENTIAL/PLATELET - Abnormal; Notable for the following components:   WBC 12.6 (*)    Hemoglobin 11.5 (*)    Neutro Abs 10.3 (*)    All other components within normal limits  URINALYSIS, ROUTINE W REFLEX MICROSCOPIC - Abnormal; Notable for the following components:   Color, Urine AMBER (*)    APPearance CLOUDY (*)    Glucose, UA 50 (*)    Bilirubin Urine SMALL (*)    Protein, ur 30 (*)    Leukocytes, UA TRACE (*)    Bacteria, UA RARE (*)    Non Squamous Epithelial 0-5 (*)    All other components within normal limits  CULTURE, BLOOD (ROUTINE X 2)  CULTURE, BLOOD (ROUTINE X 2)  I-STAT CG4 LACTIC ACID, ED  I-STAT CG4 LACTIC ACID, ED      EKG None  Radiology Dg Chest 2 View  Result Date: 03/13/2018 CLINICAL DATA:  Fever. EXAM: CHEST - 2 VIEW COMPARISON:  None. FINDINGS: The heart size and mediastinal contours are within normal limits. Both lungs are clear. The visualized skeletal structures are unremarkable. IMPRESSION: No active cardiopulmonary disease. Electronically Signed   By: Marijo Conception, M.D.  On: 03/13/2018 11:59    Procedures .Marland KitchenIncision and Drainage Date/Time: 03/13/2018 3:18 PM Performed by: Deno Etienne, DO Authorized by: Deno Etienne, DO   Consent:    Consent obtained:  Verbal   Consent given by:  Patient   Risks discussed:  Bleeding, incomplete drainage and infection   Alternatives discussed:  No treatment and delayed treatment Location:    Type:  Abscess   Location:  Anogenital   Anogenital location: right buttock. Pre-procedure details:    Skin preparation:  Chloraprep Anesthesia (see MAR for exact dosages):    Anesthesia method:  Local infiltration   Local anesthetic:  Lidocaine 2% WITH epi Procedure type:    Complexity:  Complex Procedure details:    Needle aspiration: no     Incision types:  Single straight   Incision depth:  Submucosal   Scalpel blade:  11   Wound management:  Probed and deloculated   Drainage:  Purulent and bloody   Drainage amount:  Scant   Packing materials:  None Post-procedure details:    Patient tolerance of procedure:  Tolerated well, no immediate complications   (including critical care time)  Medications Ordered in ED Medications  acetaminophen (TYLENOL) tablet 1,000 mg (1,000 mg Oral Not Given 03/13/18 1456)  ibuprofen (ADVIL,MOTRIN) tablet 800 mg (0 mg Oral Hold 03/13/18 1608)  morphine (MSIR) tablet 15 mg (15 mg Oral Given 03/13/18 1606)  enoxaparin (LOVENOX) injection 40 mg (has no administration in time range)  0.9 %  sodium chloride infusion (has no administration in time range)  ondansetron (ZOFRAN) tablet 4 mg (has no administration in time range)     Or  ondansetron (ZOFRAN) injection 4 mg (has no administration in time range)  acetaminophen (TYLENOL) tablet 650 mg (has no administration in time range)    Or  acetaminophen (TYLENOL) suppository 650 mg (has no administration in time range)  clindamycin (CLEOCIN) IVPB 600 mg (has no administration in time range)  insulin aspart (novoLOG) injection 0-5 Units (has no administration in time range)  insulin aspart (novoLOG) injection 0-15 Units (has no administration in time range)  lidocaine-EPINEPHrine (XYLOCAINE W/EPI) 2 %-1:200000 (PF) injection 20 mL (20 mLs Intradermal Given by Other 03/13/18 1455)  sodium chloride 0.9 % bolus 1,000 mL (0 mLs Intravenous Stopped 03/13/18 1608)  ondansetron (ZOFRAN) injection 4 mg (4 mg Intravenous Given 03/13/18 1456)  clindamycin (CLEOCIN) IVPB 900 mg (900 mg Intravenous New Bag/Given 03/13/18 1554)     Initial Impression / Assessment and Plan / ED Course  I have reviewed the triage vital signs and the nursing notes.  Pertinent labs & imaging results that were available during my care of the patient were reviewed by me and considered in my medical decision making (see chart for details).     77 yo F with bump to right buttock, having rapid spreading redness and subjective fevers and chills at home. Not eating and drinking due to severe nausea.  Seen by PCP two days ago and started on doxy and zofran without improvement.  Clinically with abscess drained at bedside, leukocytosis here, looks like bump in renal function when compared with outside labs, baseline creatinine about 1.1.  Will discuss with hospitalist as failure of outpatient therapy.  Started on clinda.   The patients results and plan were reviewed and discussed.   Any x-rays performed were independently reviewed by myself.   Differential diagnosis were considered with the presenting HPI.  Medications  acetaminophen (TYLENOL) tablet 1,000 mg (1,000 mg Oral Not Given 03/13/18  1456)  ibuprofen  (ADVIL,MOTRIN) tablet 800 mg (0 mg Oral Hold 03/13/18 1608)  morphine (MSIR) tablet 15 mg (15 mg Oral Given 03/13/18 1606)  enoxaparin (LOVENOX) injection 40 mg (has no administration in time range)  0.9 %  sodium chloride infusion (has no administration in time range)  ondansetron (ZOFRAN) tablet 4 mg (has no administration in time range)    Or  ondansetron (ZOFRAN) injection 4 mg (has no administration in time range)  acetaminophen (TYLENOL) tablet 650 mg (has no administration in time range)    Or  acetaminophen (TYLENOL) suppository 650 mg (has no administration in time range)  clindamycin (CLEOCIN) IVPB 600 mg (has no administration in time range)  insulin aspart (novoLOG) injection 0-5 Units (has no administration in time range)  insulin aspart (novoLOG) injection 0-15 Units (has no administration in time range)  lidocaine-EPINEPHrine (XYLOCAINE W/EPI) 2 %-1:200000 (PF) injection 20 mL (20 mLs Intradermal Given by Other 03/13/18 1455)  sodium chloride 0.9 % bolus 1,000 mL (0 mLs Intravenous Stopped 03/13/18 1608)  ondansetron (ZOFRAN) injection 4 mg (4 mg Intravenous Given 03/13/18 1456)  clindamycin (CLEOCIN) IVPB 900 mg (900 mg Intravenous New Bag/Given 03/13/18 1554)    Vitals:   03/13/18 1119  BP: (!) 142/65  Pulse: 94  Resp: 16  Temp: 98.7 F (37.1 C)  TempSrc: Oral  SpO2: 98%    Final diagnoses:  Cellulitis and abscess of buttock    Admission/ observation were discussed with the admitting physician, patient and/or family and they are comfortable with the plan.    Final Clinical Impressions(s) / ED Diagnoses   Final diagnoses:  Cellulitis and abscess of buttock    ED Discharge Orders    None       Deno Etienne, DO 03/13/18 1651

## 2018-03-13 NOTE — ED Triage Notes (Signed)
Pt in with possible cellulitis to her buttocks, went to her PCP on Wednesday and started an antibiotic at that time, patient reports increased redness and swelling since Wednesday

## 2018-03-13 NOTE — ED Notes (Signed)
Pt ambulated to restroom with a steady gait to give urine specimen.

## 2018-03-14 DIAGNOSIS — F418 Other specified anxiety disorders: Secondary | ICD-10-CM | POA: Diagnosis present

## 2018-03-14 DIAGNOSIS — D6489 Other specified anemias: Secondary | ICD-10-CM | POA: Diagnosis present

## 2018-03-14 DIAGNOSIS — L03317 Cellulitis of buttock: Secondary | ICD-10-CM

## 2018-03-14 DIAGNOSIS — Z7982 Long term (current) use of aspirin: Secondary | ICD-10-CM | POA: Diagnosis not present

## 2018-03-14 DIAGNOSIS — D638 Anemia in other chronic diseases classified elsewhere: Secondary | ICD-10-CM

## 2018-03-14 DIAGNOSIS — Z885 Allergy status to narcotic agent status: Secondary | ICD-10-CM | POA: Diagnosis not present

## 2018-03-14 DIAGNOSIS — D696 Thrombocytopenia, unspecified: Secondary | ICD-10-CM

## 2018-03-14 DIAGNOSIS — Z79899 Other long term (current) drug therapy: Secondary | ICD-10-CM | POA: Diagnosis not present

## 2018-03-14 DIAGNOSIS — N179 Acute kidney failure, unspecified: Secondary | ICD-10-CM | POA: Diagnosis present

## 2018-03-14 DIAGNOSIS — E1165 Type 2 diabetes mellitus with hyperglycemia: Secondary | ICD-10-CM | POA: Diagnosis present

## 2018-03-14 DIAGNOSIS — E785 Hyperlipidemia, unspecified: Secondary | ICD-10-CM | POA: Diagnosis present

## 2018-03-14 DIAGNOSIS — I10 Essential (primary) hypertension: Secondary | ICD-10-CM | POA: Diagnosis present

## 2018-03-14 DIAGNOSIS — Z7984 Long term (current) use of oral hypoglycemic drugs: Secondary | ICD-10-CM | POA: Diagnosis not present

## 2018-03-14 DIAGNOSIS — L0231 Cutaneous abscess of buttock: Principal | ICD-10-CM

## 2018-03-14 DIAGNOSIS — Z791 Long term (current) use of non-steroidal anti-inflammatories (NSAID): Secondary | ICD-10-CM | POA: Diagnosis not present

## 2018-03-14 LAB — CBC
HEMATOCRIT: 29.6 % — AB (ref 36.0–46.0)
Hemoglobin: 9.3 g/dL — ABNORMAL LOW (ref 12.0–15.0)
MCH: 29.5 pg (ref 26.0–34.0)
MCHC: 31.4 g/dL (ref 30.0–36.0)
MCV: 94 fL (ref 78.0–100.0)
Platelets: 114 10*3/uL — ABNORMAL LOW (ref 150–400)
RBC: 3.15 MIL/uL — ABNORMAL LOW (ref 3.87–5.11)
RDW: 12.7 % (ref 11.5–15.5)
WBC: 6.6 10*3/uL (ref 4.0–10.5)

## 2018-03-14 LAB — BASIC METABOLIC PANEL
Anion gap: 9 (ref 5–15)
BUN: 27 mg/dL — ABNORMAL HIGH (ref 8–23)
CALCIUM: 8 mg/dL — AB (ref 8.9–10.3)
CO2: 25 mmol/L (ref 22–32)
Chloride: 102 mmol/L (ref 98–111)
Creatinine, Ser: 1.65 mg/dL — ABNORMAL HIGH (ref 0.44–1.00)
GFR calc Af Amer: 34 mL/min — ABNORMAL LOW (ref >60)
GFR calc non Af Amer: 29 mL/min — ABNORMAL LOW (ref >60)
GLUCOSE: 183 mg/dL — AB (ref 70–99)
POTASSIUM: 3.7 mmol/L (ref 3.5–5.1)
Sodium: 136 mmol/L (ref 135–145)

## 2018-03-14 LAB — GLUCOSE, CAPILLARY
GLUCOSE-CAPILLARY: 70 mg/dL (ref 70–99)
GLUCOSE-CAPILLARY: 146 mg/dL — AB (ref 70–99)
Glucose-Capillary: 144 mg/dL — ABNORMAL HIGH (ref 70–99)
Glucose-Capillary: 150 mg/dL — ABNORMAL HIGH (ref 70–99)

## 2018-03-14 MED ORDER — HYPROMELLOSE (GONIOSCOPIC) 2.5 % OP SOLN
1.0000 [drp] | OPHTHALMIC | Status: DC | PRN
  Filled 2018-03-14: qty 15

## 2018-03-14 MED ORDER — PROCHLORPERAZINE EDISYLATE 10 MG/2ML IJ SOLN
10.0000 mg | Freq: Four times a day (QID) | INTRAMUSCULAR | Status: DC | PRN
  Administered 2018-03-15: 10 mg via INTRAVENOUS
  Filled 2018-03-14 (×3): qty 2

## 2018-03-14 MED ORDER — SERTRALINE HCL 100 MG PO TABS
100.0000 mg | ORAL_TABLET | Freq: Every day | ORAL | Status: DC
  Administered 2018-03-14 – 2018-03-16 (×3): 100 mg via ORAL
  Filled 2018-03-14 (×3): qty 1

## 2018-03-14 MED ORDER — ASPIRIN 81 MG PO CHEW
81.0000 mg | CHEWABLE_TABLET | Freq: Every day | ORAL | Status: DC
  Administered 2018-03-14 – 2018-03-16 (×3): 81 mg via ORAL
  Filled 2018-03-14 (×3): qty 1

## 2018-03-14 MED ORDER — SIMVASTATIN 40 MG PO TABS
40.0000 mg | ORAL_TABLET | Freq: Every day | ORAL | Status: DC
  Administered 2018-03-14 – 2018-03-16 (×3): 40 mg via ORAL
  Filled 2018-03-14 (×3): qty 1

## 2018-03-14 MED ORDER — PROPYLENE GLYCOL 0.6 % OP SOLN: 1.0000 [drp] | OPHTHALMIC | Status: DC | PRN

## 2018-03-14 MED ORDER — PANTOPRAZOLE SODIUM 40 MG PO TBEC
40.0000 mg | DELAYED_RELEASE_TABLET | Freq: Every day | ORAL | Status: DC
  Administered 2018-03-14 – 2018-03-16 (×3): 40 mg via ORAL
  Filled 2018-03-14 (×3): qty 1

## 2018-03-14 MED ORDER — ALPRAZOLAM 0.25 MG PO TABS: 0.2500 mg | ORAL_TABLET | Freq: Every evening | ORAL | Status: DC | PRN

## 2018-03-14 MED ORDER — METOPROLOL SUCCINATE ER 100 MG PO TB24
100.0000 mg | ORAL_TABLET | Freq: Every day | ORAL | Status: DC
  Administered 2018-03-15 – 2018-03-16 (×2): 100 mg via ORAL
  Filled 2018-03-14 (×2): qty 1

## 2018-03-14 NOTE — Progress Notes (Addendum)
  PROGRESS NOTE  Fabiola Mudgett SVX:[id number] DOB: [birth year] DOA: 03/13/2018 PCP: Jamesetta Geralds, MD  Brief Narrative: 77 year old woman PMH diabetes mellitus type 2 presented with worsening pain and erythema of her right buttock.  Associated with nausea, vomiting, malaise.  Underwent incision and drainage in the emergency department, admitted for intravenous antibiotics and treatment of nausea.  Assessment/Plan Cellulitis, abscess right buttock.  With associated systemic symptoms.  Status post I&D in the emergency department 8/9. --Afebrile.  Hemodynamic stable.  Continue intravenous antibiotics, follow-up culture data. --Wound care daily and as needed.  Acute kidney injury likely secondary to poor oral intake. --IV fluids.  Recheck BMP in a.m.  Nausea.  Likely related to acute infection.  Antiemetics.  IV fluids.  Supportive care.  Diabetes mellitus type 2 --Anion gap within normal limits.  Anemia of acute illness. --Follow clinically.  Thrombocytopenia --Secondary to acute infection.  Follow daily CBC.   DVT prophylaxis: enoxaparin Code Status: Full Family Communication: daughter at bedside Disposition Plan: home    Murray Hodgkins, MD  [facility] Direct contact: [phone] --Via [place]  --[email or web address]; password [email or web address]  7PM-7AM contact night coverage as above 03/14/2018, 2:01 PM  LOS: 0 days   Consultants:    Procedures:  I&D in ED  Antimicrobials:  Clindamycin 8/9 >>  Interval history/Subjective: Feels nauseous, vomited after trying to eat, continues to have some right buttock pain.  Objective: Vitals:  Vitals:   03/14/18 0521 03/14/18 1339  BP: (!) 106/47 102/73  Pulse: 60 64  Resp: 12   Temp: 98.7 F (37.1 C) 98.6 F (37 C)  SpO2: 99% 90%    Exam:  Constitutional:  . Appears calm, uncomfortable, nontoxic Eyes:  . pupils and irises appear normal . Normal lids ENMT:  . grossly normal hearing  . Lips appear  normal Respiratory:  . CTA bilaterally, no w/r/r.  . Respiratory effort normal.  Cardiovascular:  . RRR, no m/r/g . No LE extremity edema   Abdomen:  . Abdomen appears normal; no tenderness or masses Skin:  . Buttock examined, area of abscess noted approximately half dollar size.  Mild surrounding erythema. Psychiatric:  . Mental status o Mood, affect appropriate  I have personally reviewed the following:   Labs:  CBG stable  Creatinine without significant change, 1.65.  BUN elevated 27.  Hemoglobin 9.3.  Leukocytosis has resolved, 6.6.  Platelets 114.  Imaging studies:  Chest x-ray no acute disease  Scheduled Meds: . aspirin  81 mg Oral Daily  . enoxaparin (LOVENOX) injection  40 mg Subcutaneous Q24H  . insulin aspart  0-15 Units Subcutaneous TID WC  . insulin aspart  0-5 Units Subcutaneous QHS  . [START ON 03/15/2018] metoprolol succinate  100 mg Oral Daily  . pantoprazole  40 mg Oral Daily  . sertraline  100 mg Oral Daily  . simvastatin  40 mg Oral Daily   Continuous Infusions: . sodium chloride 100 mL/hr at 03/13/18 1847  . clindamycin (CLEOCIN) IV 600 mg (03/14/18 0537)    Principal Problem:   Cellulitis and abscess of buttock Active Problems:   AKI (acute kidney injury) ([facility])   Diabetes mellitus type 2, uncontrolled ([place])   Anemia in other chronic diseases classified elsewhere   Thrombocytopenia ([place])   LOS: 0 days

## 2018-03-15 LAB — BASIC METABOLIC PANEL
ANION GAP: 9 (ref 5–15)
BUN: 21 mg/dL (ref 8–23)
CALCIUM: 8 mg/dL — AB (ref 8.9–10.3)
CO2: 26 mmol/L (ref 22–32)
Chloride: 101 mmol/L (ref 98–111)
Creatinine, Ser: 1.55 mg/dL — ABNORMAL HIGH (ref 0.44–1.00)
GFR, EST AFRICAN AMERICAN: 36 mL/min — AB (ref >60)
GFR, EST NON AFRICAN AMERICAN: 31 mL/min — AB (ref >60)
GLUCOSE: 152 mg/dL — AB (ref 70–99)
Potassium: 3.9 mmol/L (ref 3.5–5.1)
Sodium: 136 mmol/L (ref 135–145)

## 2018-03-15 LAB — GLUCOSE, CAPILLARY
GLUCOSE-CAPILLARY: 129 mg/dL — AB (ref 70–99)
Glucose-Capillary: 142 mg/dL — ABNORMAL HIGH (ref 70–99)
Glucose-Capillary: 180 mg/dL — ABNORMAL HIGH (ref 70–99)
Glucose-Capillary: 124 mg/dL — ABNORMAL HIGH (ref 70–99)

## 2018-03-15 LAB — CBC
HCT: 30.1 % — ABNORMAL LOW (ref 36.0–46.0)
HEMOGLOBIN: 9.4 g/dL — AB (ref 12.0–15.0)
MCH: 29.3 pg (ref 26.0–34.0)
MCHC: 31.2 g/dL (ref 30.0–36.0)
MCV: 93.8 fL (ref 78.0–100.0)
PLATELETS: 135 10*3/uL — AB (ref 150–400)
RBC: 3.21 MIL/uL — ABNORMAL LOW (ref 3.87–5.11)
RDW: 12.6 % (ref 11.5–15.5)
WBC: 6 10*3/uL (ref 4.0–10.5)

## 2018-03-15 NOTE — Progress Notes (Signed)
  PROGRESS NOTE  Amber Gentry DEY:[id number] DOB: 1940-12-20 DOA: 03/13/2018 PCP: Jamesetta Geralds, MD  Brief Narrative: 77 year old woman PMH diabetes mellitus type 2 presented with worsening pain and erythema of her right buttock.  Associated with nausea, vomiting, malaise.  Underwent incision and drainage in the emergency department, admitted for intravenous antibiotics and treatment of nausea.  Assessment/Plan Cellulitis, abscess right buttock.  With associated systemic symptoms.  Status post I&D in the emergency department 8/9. --Remains afebrile, hemodynamic stable.  Wound appears to be improving.  Continue empiric antibiotics today.  Follow-up culture data.  Acute kidney injury likely secondary to poor oral intake. --Improving with IV fluids.  Repeat BMP in a.m.  Continue IV fluids.  Nausea.  Likely related to acute infection.   --Resolving.  Tolerating diet.  Continue supportive care.  Diabetes mellitus type 2 --CBG remains stable.  Anemia of acute illness. --Hemoglobin stable.  Follow-up as an outpatient.  Thrombocytopenia --Secondary to acute infection.  Trending upwards.   Improving.  Likely will be able to go home tomorrow if continues to tolerate diet and wound improves.  DVT prophylaxis: enoxaparin Code Status: Full Family Communication: Discussed with daughter at bedside Disposition Plan: home    Murray Hodgkins, MD  [facility] Direct contact: [phone] --Via [place]  --[email or web address]; password [email or web address]  7PM-7AM contact night coverage as above 03/15/2018, 10:17 AM  LOS: 1 day   Consultants:    Procedures:  I&D in ED  Antimicrobials:  Clindamycin 8/9 >>  Interval history/Subjective: Feels better; nausea resolved; tolerated breakfast; decreased buttock pain  Objective: Vitals:  Vitals:   03/15/18 0428 03/15/18 0912  BP: (!) 141/55 135/60  Pulse: 82 69  Resp:    Temp: 100.1 F (37.8 C)   SpO2: 92%      Exam:  Constitutional:   . Appears calm and comfortable Eyes:  . pupils and irises appear normal . Normal lids  ENMT:  . grossly normal hearing  Respiratory:  . CTA bilaterally, no w/r/r.  . Respiratory effort normal.  Cardiovascular:  . RRR, no m/r/g . No LE extremity edema   Skin:  . decreasing erythema right buttock; wound with granulation tissues Psychiatric:  . Mental status o Mood, affect appropriate  I have personally reviewed the following:   Labs:  BUN has normalized, creatinine trending down 1.55.  Remainder BMP unremarkable.  Hemoglobin stable 9.4.  Platelets trending up, 135.  Scheduled Meds: . aspirin  81 mg Oral Daily  . enoxaparin (LOVENOX) injection  40 mg Subcutaneous Q24H  . insulin aspart  0-15 Units Subcutaneous TID WC  . insulin aspart  0-5 Units Subcutaneous QHS  . metoprolol succinate  100 mg Oral Daily  . pantoprazole  40 mg Oral Daily  . sertraline  100 mg Oral Daily  . simvastatin  40 mg Oral Daily   Continuous Infusions: . sodium chloride 100 mL/hr at 03/15/18 0907  . clindamycin (CLEOCIN) IV 600 mg (03/15/18 0514)    Principal Problem:   Cellulitis and abscess of buttock Active Problems:   AKI (acute kidney injury) ([place])   Diabetes mellitus type 2, uncontrolled ([place])   Anemia in other chronic diseases classified elsewhere   Thrombocytopenia ([facility])   Abscess of buttock, right   LOS: 1 day

## 2018-03-16 LAB — GLUCOSE, CAPILLARY: Glucose-Capillary: 154 mg/dL — ABNORMAL HIGH (ref 70–99)

## 2018-03-16 NOTE — Plan of Care (Signed)
  Problem: Education: Goal: Knowledge of General Education information will improve Description Including pain rating scale, medication(s)/side effects and non-pharmacologic comfort measures Outcome: Progressing   Problem: Health Behavior/Discharge Planning: Goal: Ability to manage health-related needs will improve Outcome: Progressing   Problem: Clinical Measurements: Goal: Ability to maintain clinical measurements within normal limits will improve Outcome: Progressing Goal: Will remain free from infection Outcome: Progressing Goal: Diagnostic test results will improve Outcome: Progressing Goal: Respiratory complications will improve Outcome: Progressing Goal: Cardiovascular complication will be avoided Outcome: Progressing   Problem: Activity: Goal: Risk for activity intolerance will decrease Outcome: Progressing   Problem: Coping: Goal: Level of anxiety will decrease Outcome: Progressing   Problem: Safety: Goal: Ability to remain free from injury will improve Outcome: Progressing   Problem: Skin Integrity: Goal: Risk for impaired skin integrity will decrease Outcome: Progressing   Problem: Clinical Measurements: Goal: Ability to avoid or minimize complications of infection will improve Outcome: Progressing   Problem: Skin Integrity: Goal: Skin integrity will improve Outcome: Progressing

## 2018-03-16 NOTE — Discharge Instructions (Signed)

## 2018-03-16 NOTE — Care Management Important Message (Signed)
Important Message  Patient Details  Name: Amber Gentry MRN: [id number] Date of Birth: [birth year]   Medicare Important Message Given:  Yes    [name] [address] 03/16/2018, 4:15 PM

## 2018-03-16 NOTE — Care Management Important Message (Signed)
Important Message  Patient Details  Name: Amber Gentry MRN: [id number] Date of Birth: [birth year]   Medicare Important Message Given:  Yes    Erenest Rasher, RN 03/16/2018, 11:58 AM

## 2018-03-16 NOTE — Discharge Summary (Signed)
Physician Discharge Summary  Amber Gentry YIF:[id number] DOB: 17-Jan-1941 DOA: 03/13/2018  PCP: Jamesetta Geralds, MD  Admit date: 03/13/2018 Discharge date: 03/16/2018  Recommendations for Outpatient Follow-up:   Cellulitis, abscess right buttock.    Acute kidney injury --Stable during this hospitalization with some improvement.  Anticipate return to baseline as an outpatient now that she is tolerating a diet.  Follow-up as an outpatient with repeat BMP as clinically indicated.  Diabetes mellitus type 2 --CBG remained stable.  Anemia of acute illness. --Hemoglobin remained stable.  Follow-up as an outpatient.   Follow-up Information    Radiontchenko, Alexei, MD. Schedule an appointment as soon as possible for a visit in 1 week(s).   Specialty:  Family Medicine Contact information: [address] [place] [address] [phone]            Discharge Diagnoses:  Cellulitis, abscess right buttock.  With associated systemic symptoms.  Status post I&D in the emergency department 8/9. --Wound continues to improve.  Unfortunately no culture was sent from the emergency department.  She responded well to clindamycin.  She was prescribed doxycycline shortly before being admitted to the hospital, she will continue this as an outpatient as she already has obtained this prescription  Acute kidney injury --Stable during this hospitalization with some improvement.  Anticipate return to baseline as an outpatient now that she is tolerating a diet.  Follow-up as an outpatient with repeat BMP as clinically indicated.  Nausea.  Likely related to acute infection.   --Resolved with treatment of acute infection.  Tolerating diet.  Continue supportive care.  Diabetes mellitus type 2 --CBG remained stable.  Anemia of acute illness. --Hemoglobin remained stable.  Follow-up as an outpatient.  Thrombocytopenia --Secondary to acute infection.  Trending upwards  Discharge  Condition: improved Disposition: home  Diet recommendation: heart healthy, diabetic diet  Filed Weights   03/14/18 1800  Weight: 76.7 kg    History of present illness:  77 year old woman PMH diabetes mellitus type 2 presented with worsening pain and erythema of her right buttock.  Associated with nausea, vomiting, malaise.  Underwent incision and drainage in the emergency department, admitted for intravenous antibiotics and treatment of nausea.  Hospital Course:  Treated with antibiotics with gradual clinical improvement.  Hospitalization was uncomplicated.  Individual issues as below.  Cellulitis, abscess right buttock.  With associated systemic symptoms.  Status post I&D in the emergency department 8/9. --Wound continues to improve.  Unfortunately no culture was sent from the emergency department.  She responded well to clindamycin.  She was prescribed doxycycline shortly before being admitted to the hospital, she will continue this as an outpatient as she already has obtained this prescription  Acute kidney injury --Stable during this hospitalization with some improvement.  Anticipate return to baseline as an outpatient now that she is tolerating a diet.  Follow-up as an outpatient with repeat BMP as clinically indicated.  Nausea.  Likely related to acute infection.   --Resolved with treatment of acute infection.  Tolerating diet.  Continue supportive care.  Diabetes mellitus type 2 --CBG remained stable.  Anemia of acute illness. --Hemoglobin remained stable.  Follow-up as an outpatient.  Thrombocytopenia --Secondary to acute infection.  Trending upwards.  Procedures:  I&D in ED  Antimicrobials:  Clindamycin 8/9 >> 8/12  Doxycycline 8/12 >>  Today's assessment: S: feels better. Nausea essentially gone. Tolerating diet. Much less buttock pain. O: Vitals:  Vitals:   03/15/18 1926 03/16/18 0354  BP: (!) 115/49 (!) 143/59  Pulse: (!) 58  69  Resp: 16 16    Temp: 99.6 F (37.6 C) 100 F (37.8 C)  SpO2: 98% 92%    Constitutional:  . Appears calm and comfortable Respiratory:  . CTA bilaterally, no w/r/r.  . Respiratory effort normal.  Cardiovascular:  . RRR, no m/r/g Skin:  . Induration right buttock decreasing in size and firmness; no drainage/exudate. Surrounding erythema 95% reduced. Psychiatric:  . Mental status o Mood, affect appropriate  CBG stable  Discharge Instructions  Discharge Instructions    Activity as tolerated - No restrictions   Complete by:  As directed    Diet - low sodium heart healthy   Complete by:  As directed    Diet Carb Modified   Complete by:  As directed    Discharge instructions   Complete by:  As directed    Call your physician or seek immediate medical attention for fever, pain, swelling, drainage or worsening of condition.     Allergies as of 03/16/2018      Reactions   Acetaminophen Other (See Comments)   ELEVATED LIVER ENZYME ELEVATED LIVER ENZYME   Percocet [oxycodone-acetaminophen]       Medication List    STOP taking these medications   diclofenac sodium 1 % Gel Commonly known as:  VOLTAREN   meloxicam 7.5 MG tablet Commonly known as:  MOBIC     TAKE these medications   ALPRAZolam 0.25 MG tablet Commonly known as:  XANAX Take 0.25 mg by mouth at bedtime as needed.   aspirin 81 MG chewable tablet Chew by mouth daily.   doxycycline 100 MG capsule Commonly known as:  VIBRAMYCIN Take 100 mg by mouth 2 (two) times daily.   glipiZIDE 10 MG 24 hr tablet Commonly known as:  GLUCOTROL XL Take 10 mg by mouth daily.   lisinopril-hydrochlorothiazide 20-12.5 MG tablet Commonly known as:  PRINZIDE,ZESTORETIC Take 1 tablet by mouth daily.   metFORMIN 1000 MG tablet Commonly known as:  GLUCOPHAGE Take 1,000 mg by mouth 2 (two) times daily with a meal.   metoprolol succinate 100 MG 24 hr tablet Commonly known as:  TOPROL-XL Take 100 mg by mouth daily.   omeprazole 20 MG  capsule Commonly known as:  PRILOSEC Take 20 mg by mouth daily.   ondansetron 4 MG disintegrating tablet Commonly known as:  ZOFRAN-ODT Take 4 mg by mouth every 8 (eight) hours as needed for nausea.   sertraline 100 MG tablet Commonly known as:  ZOLOFT Take 100 mg by mouth daily.   simvastatin 40 MG tablet Commonly known as:  ZOCOR Take 40 mg by mouth daily.   SYSTANE COMPLETE 0.6 % Soln Generic drug:  Propylene Glycol Apply 1 drop to eye as needed (dry eyes).      Allergies  Allergen Reactions  . Acetaminophen Other (See Comments)    ELEVATED LIVER ENZYME ELEVATED LIVER ENZYME   . Percocet [Oxycodone-Acetaminophen]     The results of significant diagnostics from this hospitalization (including imaging, microbiology, ancillary and laboratory) are listed below for reference.    Significant Diagnostic Studies: Dg Chest 2 View  Result Date: 03/13/2018 CLINICAL DATA:  Fever. EXAM: CHEST - 2 VIEW COMPARISON:  None. FINDINGS: The heart size and mediastinal contours are within normal limits. Both lungs are clear. The visualized skeletal structures are unremarkable. IMPRESSION: No active cardiopulmonary disease. Electronically Signed   By: Marijo Conception, M.D.   On: 03/13/2018 11:59    Microbiology: Recent Results (from the past 240 hour(s))  Blood  culture (routine x 2)     Status: None (Preliminary result)   Collection Time: 03/13/18  2:55 PM  Result Value Ref Range Status   Specimen Description BLOOD BLOOD LEFT HAND  Final   Special Requests   Final    BOTTLES DRAWN AEROBIC AND ANAEROBIC Blood Culture results may not be optimal due to an inadequate volume of blood received in culture bottles   Culture   Final    NO GROWTH 3 DAYS Performed at [facility], [place] [address], [place], [place] [address]    Report Status PENDING  Incomplete  Blood culture (routine x 2)     Status: None (Preliminary result)   Collection Time: 03/13/18  3:00 PM  Result Value Ref Range  Status   Specimen Description BLOOD BLOOD RIGHT HAND  Final   Special Requests   Final    BOTTLES DRAWN AEROBIC AND ANAEROBIC Blood Culture adequate volume   Culture   Final    NO GROWTH 3 DAYS Performed at [facility], [place] [address], [place], [place] [address]    Report Status PENDING  Incomplete     Labs: Basic Metabolic Panel: Recent Labs  Lab 03/13/18 1121 03/14/18 0422 03/15/18 0505  NA 134* 136 136  K 4.0 3.7 3.9  CL 102 102 101  CO2 18* 25 26  GLUCOSE 334* 183* 152*  BUN 23 27* 21  CREATININE 1.59* 1.65* 1.55*  CALCIUM 8.4* 8.0* 8.0*   Liver Function Tests: Recent Labs  Lab 03/13/18 1121  AST 34  ALT 32  ALKPHOS 141*  BILITOT 1.3*  PROT 6.9  ALBUMIN 3.6   CBC: Recent Labs  Lab 03/13/18 1121 03/14/18 0422 03/15/18 0505  WBC 12.6* 6.6 6.0  NEUTROABS 10.3*  --   --   HGB 11.5* 9.3* 9.4*  HCT 36.0 29.6* 30.1*  MCV 93.0 94.0 93.8  PLT 173 114* 135*   CBG: Recent Labs  Lab 03/15/18 0630 03/15/18 1225 03/15/18 1629 03/15/18 2113 03/16/18 0642  GLUCAP 142* 129* 180* 124* 154*    Principal Problem:   Cellulitis and abscess of buttock Active Problems:   AKI (acute kidney injury) ([place])   Diabetes mellitus type 2, uncontrolled ([place])   Anemia in other chronic diseases classified elsewhere   Thrombocytopenia ([facility])   Abscess of buttock, right   Time coordinating discharge: 25 minutes  Signed:  Murray Hodgkins, MD [facility] 03/16/2018, 4:17 PM

## 2018-03-16 NOTE — Plan of Care (Signed)
  Problem: Education: Goal: Knowledge of General Education information will improve Description Including pain rating scale, medication(s)/side effects and non-pharmacologic comfort measures 03/16/2018 1125 by Rance Muir, RN Outcome: Adequate for Discharge 03/16/2018 1111 by Rance Muir, RN Outcome: Progressing   Problem: Health Behavior/Discharge Planning: Goal: Ability to manage health-related needs will improve 03/16/2018 1125 by Rance Muir, RN Outcome: Adequate for Discharge 03/16/2018 1111 by Rance Muir, RN Outcome: Progressing   Problem: Clinical Measurements: Goal: Ability to maintain clinical measurements within normal limits will improve 03/16/2018 1125 by Rance Muir, RN Outcome: Adequate for Discharge 03/16/2018 1111 by Rance Muir, RN Outcome: Progressing Goal: Will remain free from infection 03/16/2018 1125 by Rance Muir, RN Outcome: Adequate for Discharge 03/16/2018 1111 by Rance Muir, RN Outcome: Progressing Goal: Diagnostic test results will improve 03/16/2018 1125 by Rance Muir, RN Outcome: Adequate for Discharge 03/16/2018 1111 by Rance Muir, RN Outcome: Progressing Goal: Respiratory complications will improve 03/16/2018 1125 by Rance Muir, RN Outcome: Adequate for Discharge 03/16/2018 1111 by Rance Muir, RN Outcome: Progressing Goal: Cardiovascular complication will be avoided 03/16/2018 1125 by Rance Muir, RN Outcome: Adequate for Discharge 03/16/2018 1111 by Rance Muir, RN Outcome: Progressing   Problem: Activity: Goal: Risk for activity intolerance will decrease 03/16/2018 1125 by Rance Muir, RN Outcome: Adequate for Discharge 03/16/2018 1111 by Rance Muir, RN Outcome: Progressing   Problem: Coping: Goal: Level of anxiety will decrease 03/16/2018 1125 by Rance Muir, RN Outcome: Adequate for Discharge 03/16/2018 1111 by Rance Muir, RN Outcome: Progressing   Problem: Safety: Goal: Ability to remain free from injury will improve 03/16/2018 1125 by Rance Muir, RN Outcome:  Adequate for Discharge 03/16/2018 1111 by Rance Muir, RN Outcome: Progressing   Problem: Skin Integrity: Goal: Risk for impaired skin integrity will decrease 03/16/2018 1125 by Rance Muir, RN Outcome: Adequate for Discharge 03/16/2018 1111 by Rance Muir, RN Outcome: Progressing   Problem: Clinical Measurements: Goal: Ability to avoid or minimize complications of infection will improve 03/16/2018 1125 by Rance Muir, RN Outcome: Adequate for Discharge 03/16/2018 1111 by Rance Muir, RN Outcome: Progressing   Problem: Skin Integrity: Goal: Skin integrity will improve 03/16/2018 1125 by Rance Muir, RN Outcome: Adequate for Discharge 03/16/2018 1111 by Rance Muir, RN Outcome: Progressing

## 2018-03-18 LAB — CULTURE, BLOOD (ROUTINE X 2)
Culture: NO GROWTH
Culture: NO GROWTH
Special Requests: ADEQUATE

## 2019-08-28 ENCOUNTER — Ambulatory Visit: Payer: Medicare Other | Attending: Family Medicine

## 2019-08-28 DIAGNOSIS — Z23 Encounter for immunization: Secondary | ICD-10-CM | POA: Insufficient documentation

## 2019-08-28 NOTE — Progress Notes (Signed)
   Covid-19 Vaccination Clinic  Name:  Valda Hagin    MRN: [id number] DOB: 09-18-1940  08/28/2019  Ms. Chao was observed post Covid-19 immunization for 15 minutes without incidence. She was provided with Vaccine Information Sheet and instruction to access the V-Safe system.   Ms. Wolfrey was instructed to call 911 with any severe reactions post vaccine: Marland Kitchen Difficulty breathing  . Swelling of your face and throat  . A fast heartbeat  . A bad rash all over your body  . Dizziness and weakness    Immunizations Administered    Name Date Dose VIS Date Route   Pfizer COVID-19 Vaccine 08/28/2019  1:52 PM 0.3 mL 07/16/2019 Intramuscular   Manufacturer: [place]   Lot: [id number]   [place]: [id number]

## 2019-09-18 ENCOUNTER — Ambulatory Visit: Payer: Medicare Other | Attending: Internal Medicine

## 2019-09-18 DIAGNOSIS — Z23 Encounter for immunization: Secondary | ICD-10-CM

## 2019-09-18 NOTE — Progress Notes (Signed)
   Covid-19 Vaccination Clinic  Name:  Amber Gentry    MRN: [id number] DOB: 12/03/1940  09/18/2019  [name] was observed post Covid-19 immunization for 15 minutes without incidence. She was provided with Vaccine Information Sheet and instruction to access the V-Safe system.   [name] was instructed to call 911 with any severe reactions post vaccine: Marland Kitchen Difficulty breathing  . Swelling of your face and throat  . A fast heartbeat  . A bad rash all over your body  . Dizziness and weakness    Immunizations Administered    Name Date Dose VIS Date Route   Pfizer COVID-19 Vaccine 09/18/2019 12:49 PM 0.3 mL 07/16/2019 Intramuscular   Manufacturer: [employer]   Lot: [id number]   [place]: [id number]

## 2020-10-03 ENCOUNTER — Emergency Department (INDEPENDENT_AMBULATORY_CARE_PROVIDER_SITE_OTHER)
Admission: EM | Admit: 2020-10-03 | Discharge: 2020-10-03 | Disposition: A | Discharge status: Home / Self Care | Payer: Medicare Other | Source: Home / Self Care

## 2020-10-03 ENCOUNTER — Emergency Department (INDEPENDENT_AMBULATORY_CARE_PROVIDER_SITE_OTHER): Payer: Medicare Other

## 2020-10-03 ENCOUNTER — Other Ambulatory Visit: Payer: Self-pay

## 2020-10-03 DIAGNOSIS — S99922A Unspecified injury of left foot, initial encounter: Secondary | ICD-10-CM | POA: Diagnosis not present

## 2020-10-03 DIAGNOSIS — M79672 Pain in left foot: Secondary | ICD-10-CM

## 2020-10-03 DIAGNOSIS — L03032 Cellulitis of left toe: Secondary | ICD-10-CM

## 2020-10-03 DIAGNOSIS — M7742 Metatarsalgia, left foot: Secondary | ICD-10-CM

## 2020-10-03 MED ORDER — SULFAMETHOXAZOLE-TRIMETHOPRIM 800-160 MG PO TABS: 1.0000 | ORAL_TABLET | Freq: Two times a day (BID) | ORAL | 0 refills | Status: AC

## 2020-10-03 NOTE — ED Triage Notes (Signed)
Patient presents to Urgent Care with complaints of left foot pain since she accidentally kicked the bedpost "several days" ago. 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 Ambulatory upon arrival, pt is diabetic and is worried about the circulation in the foot.

## 2020-10-06 ENCOUNTER — Telehealth: Payer: Self-pay | Admitting: Emergency Medicine

## 2020-10-06 NOTE — Telephone Encounter (Signed)
Pt called regarding N/V x 12 hours on Thursday. Pt was seen here on Tuesday for foot infection . Pt expressed concern about her home medications being incorrect. Per Inez Catalina, N/V have resolved, CBG was 120 this am. Shevelle stated she had stopped taking her gabapentin when she started the antibiotic because she thought they would interact. RN educated Raiven that it was okay to take both medicines as directed. Pt also stated that she had gone out to dinner on Wednesday night & had a very spicy dish. She stated the swelling in her foot was worse afterwards. Pt is currently elevating LLE & took her antibiotic today (was unable to take yesterday due to N/V). Pt educated on the importance of taking antibiotics as prescribed & to not stop gabapentin abruptly. Pt verbalized an understanding & will follow up w/ her PCP regarding an updated and current med list. Pt to follow up in ER or Urgent Care over the weekend if swelling has not decreased or N/V return.

## 2020-10-27 ENCOUNTER — Ambulatory Visit (INDEPENDENT_AMBULATORY_CARE_PROVIDER_SITE_OTHER): Payer: Medicare Other

## 2020-10-27 ENCOUNTER — Encounter: Payer: Self-pay | Admitting: Podiatry

## 2020-10-27 ENCOUNTER — Ambulatory Visit (INDEPENDENT_AMBULATORY_CARE_PROVIDER_SITE_OTHER): Payer: Medicare Other | Admitting: Podiatry

## 2020-10-27 ENCOUNTER — Other Ambulatory Visit: Payer: Self-pay

## 2020-10-27 DIAGNOSIS — M7752 Other enthesopathy of left foot: Secondary | ICD-10-CM

## 2020-10-27 DIAGNOSIS — S99922A Unspecified injury of left foot, initial encounter: Secondary | ICD-10-CM

## 2020-10-27 DIAGNOSIS — M779 Enthesopathy, unspecified: Secondary | ICD-10-CM

## 2020-10-27 MED ORDER — TRIAMCINOLONE ACETONIDE 10 MG/ML IJ SUSP
10.0000 mg | Freq: Once | INTRAMUSCULAR | Status: AC
  Administered 2020-10-27: 10 mg

## 2020-10-29 NOTE — Progress Notes (Signed)
Subjective:   Patient ID: Amber Gentry, female   DOB: 80 y.o.   MRN: [id number]   HPI Patient presents stating that she hurt the left side of her foot several weeks ago and it has improved slightly but is still very sore and she would like to be able to do more again.  Patient does not smoke likes to be active and presents with caregiver   Review of Systems  All other systems reviewed and are negative.       Objective:  Physical Exam Vitals and nursing note reviewed.  Constitutional:      Appearance: She is well-developed.  Pulmonary:     Effort: Pulmonary effort is normal.  Musculoskeletal:        General: Normal range of motion.  Skin:    General: Skin is warm.  Neurological:     Mental Status: She is alert.     Neurovascular status intact muscle strength adequate range of motion adequate with discomfort pain of the lateral side of the left foot mild swelling with inflammation mostly around the peroneal insertion base of fifth metatarsal.  Good digital perfusion well oriented x3     Assessment:  Inflammatory condition with tendinitis and probability for contusion of the area with inflammation of the tendon insertion     Plan:  H&P x-ray reviewed condition discussed.  We are in a try to hasten the healing and reduce the inflammation I did a careful steroid injection 1.5 mg dexamethasone Kenalog 5 mg Xylocaine advised on ice therapy and rigid bottom shoe gear and not going barefoot.  If symptoms persist will be seen back may require casting  X-rays indicate that there is no signs of fracture associated with condition appears to be soft tissue

## 2020-11-07 ENCOUNTER — Other Ambulatory Visit: Payer: Self-pay | Admitting: Orthopedic Surgery

## 2020-11-07 DIAGNOSIS — M545 Low back pain, unspecified: Secondary | ICD-10-CM

## 2020-12-01 ENCOUNTER — Other Ambulatory Visit: Payer: Self-pay

## 2020-12-01 ENCOUNTER — Ambulatory Visit
Admission: RE | Admit: 2020-12-01 | Discharge: 2020-12-01 | Disposition: A | Discharge status: Home / Self Care | Payer: Medicare Other | Source: Ambulatory Visit | Attending: Orthopedic Surgery | Admitting: Orthopedic Surgery

## 2020-12-01 DIAGNOSIS — M545 Low back pain, unspecified: Secondary | ICD-10-CM

## 2021-05-21 IMAGING — MR MR LUMBAR SPINE W/O CM
4 series · 27 of 48 positions shown · non-contrast
Comparison: None.

CLINICAL DATA: Low back pain.

EXAM:
MRI LUMBAR SPINE WITHOUT CONTRAST
TECHNIQUE: Multiplanar, multisequence MR imaging of the lumbar spine was
performed. No intravenous contrast was administered.

[Series 3: T2 · sagittal · 4.0mm · 0.53mm/px · 9 of 20 slices shown (1 of 2)]
[im 1/20]
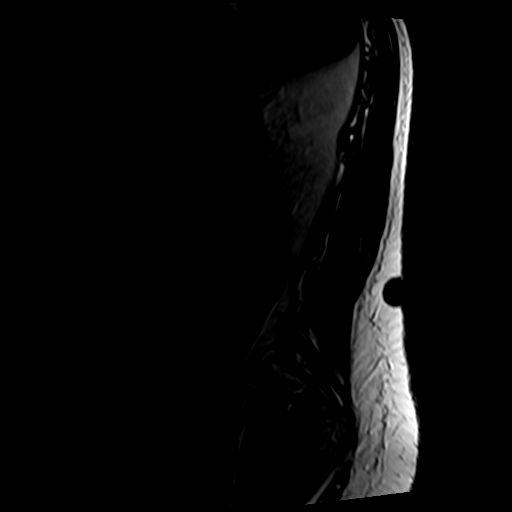
[im 3/20]
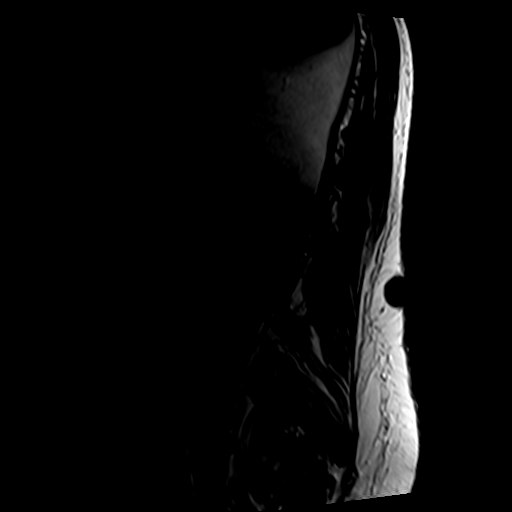
[im 5/20]
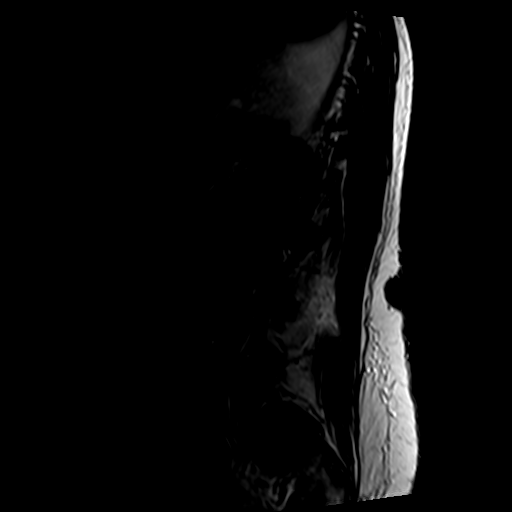
[im 8/20]
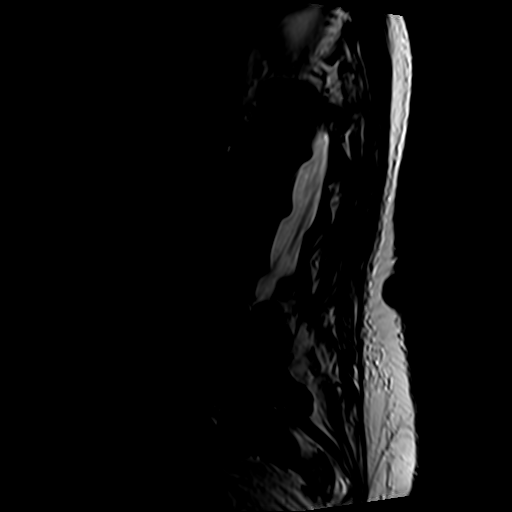
[im 10/20]
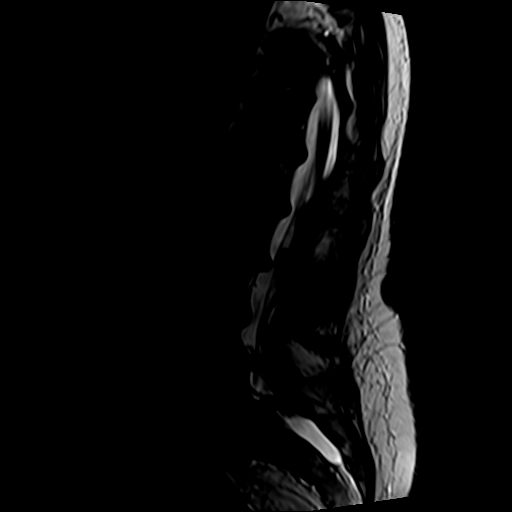
[im 12/20]
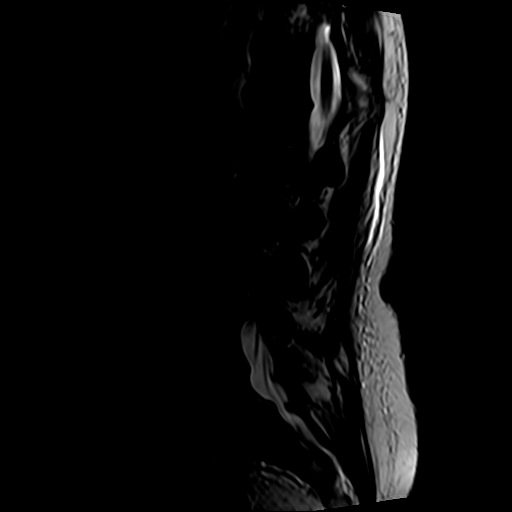
[im 15/20]
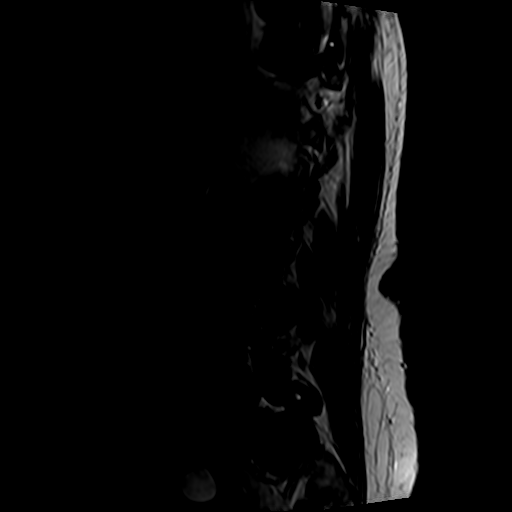
[im 17/20]
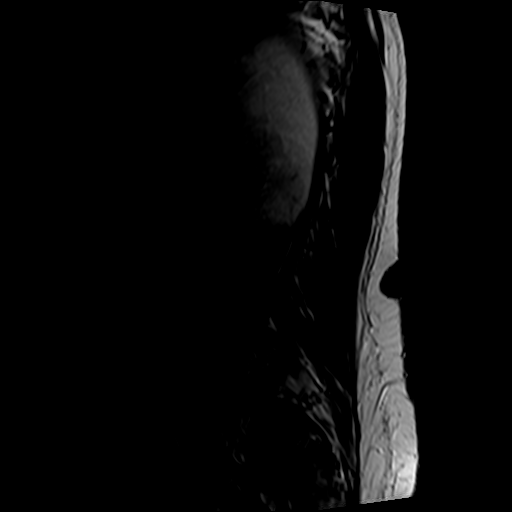
[im 20/20]
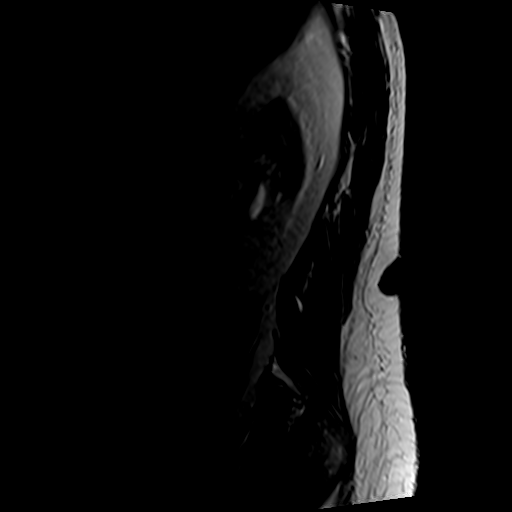

[Series 4: STIR · sagittal · 4.0mm · 1.05mm/px · 3 of 20 slices shown]
[im 3/20]
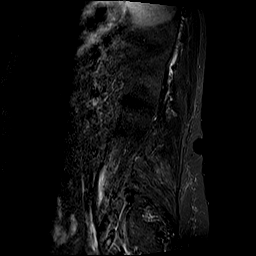
[im 11/20]
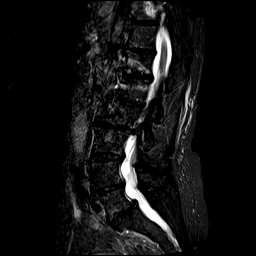
[im 17/20]
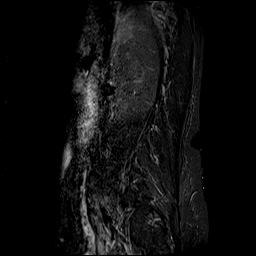

[Series 5: T1 · sagittal · 4.0mm · 0.53mm/px · 6 of 20 slices shown]
[im 1/20]
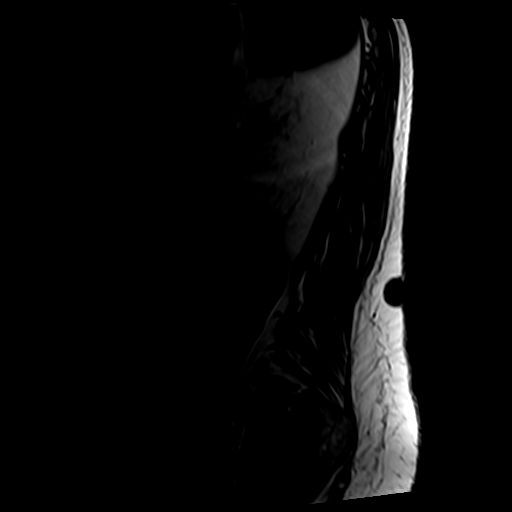
[im 3/20]
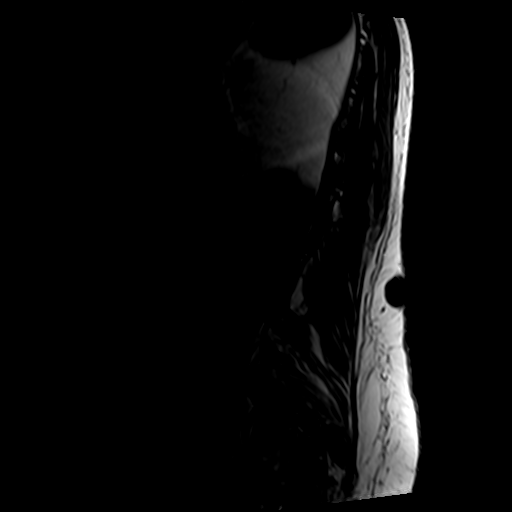
[im 7/20]
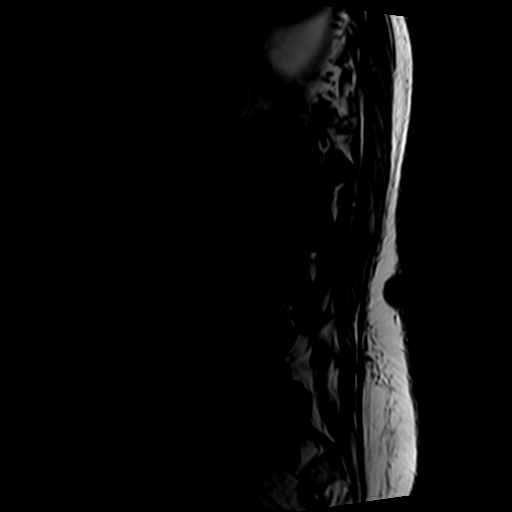
[im 9/20]
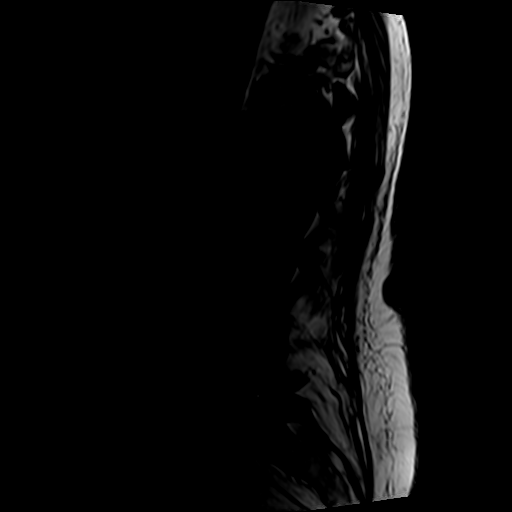
[im 11/20]
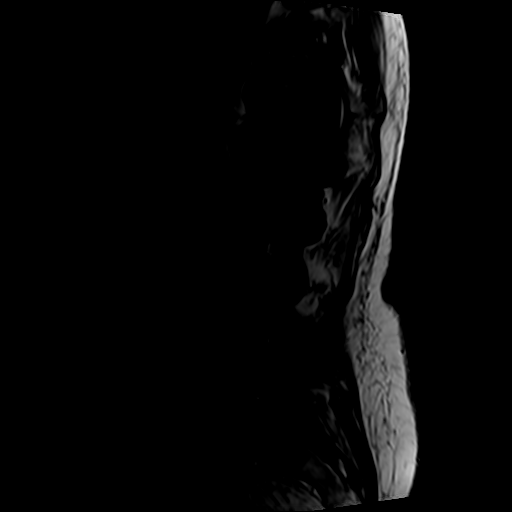
[im 17/20]
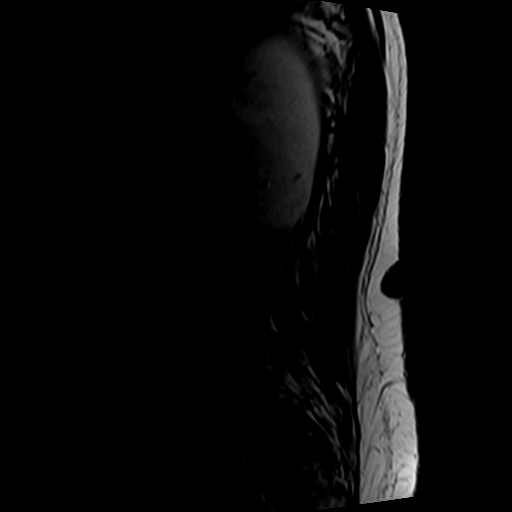

[Series 6: T2 · axial · 4.0mm · 0.70mm/px · z∈[-54,+130]mm · 9 of 40 slices shown (2 of 2)]
[im 3/40]
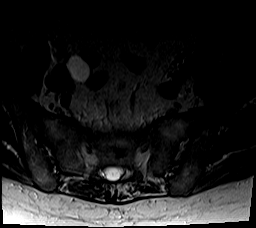
[im 7/40]
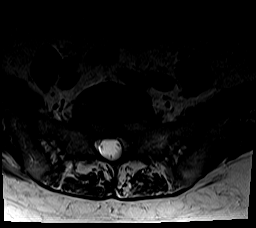
[im 11/40]
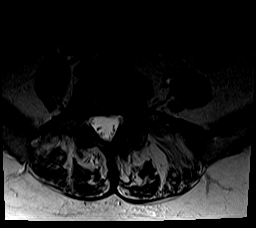
[im 18/40]
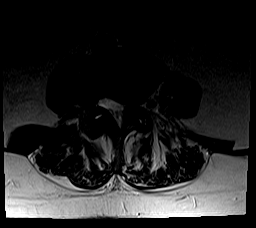
[im 20/40]
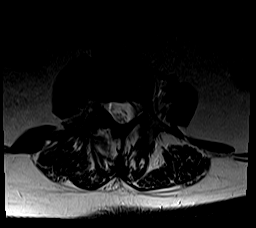
[im 22/40]
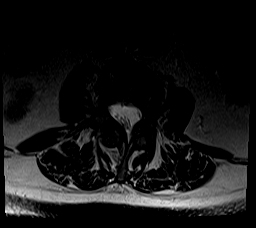
[im 29/40]
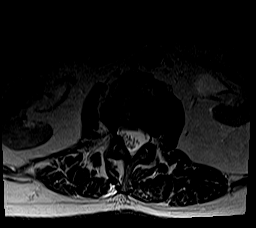
[im 33/40]
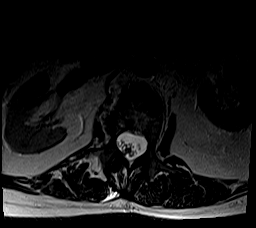
[im 37/40]
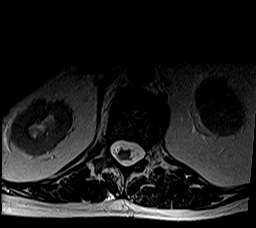

[27 of 48 positions shown; findings below may reference images not displayed]

FINDINGS: Segmentation:  Standard.

Alignment: Levoconvex scoliosis of the lumbar spine. Small
anterolisthesis of L5 over S1. Small retrolisthesis at L2-3 and
L3-4.

Vertebrae: No fracture, evidence of discitis, or bone lesion.
Endplate degenerative changes, more pronounced at T12-L1 and L5-S1.

Conus medullaris and cauda equina: Conus extends to the L1 level.
Conus and cauda equina appear normal.

Paraspinal and other soft tissues: A 2 cm right adnexal cyst, likely
benign.

Disc levels:

T12-L1: Loss of disc height, disc bulge with superimposed
osteophytic component and mild facet degenerative changes resulting
in moderate bilateral neural foraminal. No spinal canal stenosis.

L1-2: Loss of disc height, disc bulge and mild facet degenerative
changes resulting moderate right and mild left neural foraminal
narrowing. No spinal canal stenosis.

L2-3: Loss of disc height, disc bulge and moderate facet
degenerative changes resulting in moderate right and mild left
neural foraminal narrowing. No significant spinal canal stenosis.

L3-4: Mild loss of disc height, disc bulge and moderate facet
degenerative changes resulting in mild spinal canal stenosis with
narrowing of the bilateral subarticular zones and moderate bilateral
neural foraminal narrowing.

L4-5: Loss of disc height, disc bulge with superimposed left
subarticular disc extrusion migrating superiorly, moderate right and
advanced left facet degenerative changes. Findings result in
effacement of the left subarticular zone and severe left neural
foraminal narrowing.

L5-S1: Loss of disc height, left disc bulge with superimposed
osteophytic component and prominent hypertrophic facet degenerative
changes. Findings result in narrowing of the bilateral subarticular
zones, left greater than right, moderate right and severe left
neural foraminal narrowing.
IMPRESSION: 1. Multilevel degenerative changes of the lumbar spine as described
above.
2. Mild spinal canal stenosis at L3-L4.
3. Severe left neural foraminal narrowing at L4-5 and L5-S1.

## 2022-01-09 ENCOUNTER — Ambulatory Visit (INDEPENDENT_AMBULATORY_CARE_PROVIDER_SITE_OTHER): Payer: Medicare Other | Admitting: Obstetrics and Gynecology

## 2022-01-09 ENCOUNTER — Encounter: Payer: Self-pay | Admitting: Obstetrics and Gynecology

## 2022-01-09 ENCOUNTER — Other Ambulatory Visit (HOSPITAL_COMMUNITY)
Admission: RE | Admit: 2022-01-09 | Discharge: 2022-01-09 | Disposition: A | Discharge status: Home / Self Care | Payer: Medicare Other | Source: Ambulatory Visit | Attending: Obstetrics and Gynecology | Admitting: Obstetrics and Gynecology

## 2022-01-09 VITALS — BP 120/74 | HR 84 | Ht 63.0 in | Wt 175.0 lb

## 2022-01-09 DIAGNOSIS — N950 Postmenopausal bleeding: Secondary | ICD-10-CM | POA: Insufficient documentation

## 2022-01-09 DIAGNOSIS — Z1151 Encounter for screening for human papillomavirus (HPV): Secondary | ICD-10-CM | POA: Insufficient documentation

## 2022-01-09 DIAGNOSIS — Z01419 Encounter for gynecological examination (general) (routine) without abnormal findings: Secondary | ICD-10-CM | POA: Diagnosis not present

## 2022-01-09 DIAGNOSIS — Z124 Encounter for screening for malignant neoplasm of cervix: Secondary | ICD-10-CM | POA: Diagnosis not present

## 2022-01-09 NOTE — Patient Instructions (Signed)
Endometrial Biopsy  An endometrial biopsy is a procedure to remove tissue samples from the endometrium, which is the lining of the uterus. The tissue that is removed can then be checked under a microscope for disease. This procedure is used to diagnose conditions such as endometrial cancer, endometrial tuberculosis, polyps, or other inflammatory conditions. This procedure may also be used to investigate uterine bleeding to determine where you are in your menstrual cycle or how your hormone levels are affecting the lining of the uterus. Tell a health care provider about: Any allergies you have. All medicines you are taking, including vitamins, herbs, eye drops, creams, and over-the-counter medicines. Any problems you or family members have had with anesthetic medicines. Any blood disorders you have. Any surgeries you have had. Any medical conditions you have. Whether you are pregnant or may be pregnant. What are the risks? Generally, this is a safe procedure. However, problems may occur, including: Bleeding. Pelvic infection. Puncture of the wall of the uterus with the biopsy device (rare). Allergic reactions to medicines. What happens before the procedure? Keep a record of your menstrual cycles as told by your health care provider. You may need to schedule your procedure for a specific time in your cycle. You may want to bring a sanitary pad to wear after the procedure. Plan to have someone take you home from the hospital or clinic. Ask your health care provider about: Changing or stopping your regular medicines. This is especially important if you are taking diabetes medicines, arthritis medicines, or blood thinners. Taking medicines such as aspirin and ibuprofen. These medicines can thin your blood. Do not take these medicines unless your health care provider tells you to take them. Taking over-the-counter medicines, vitamins, herbs, and supplements. What happens during the  procedure? You will lie on an exam table with your feet and legs supported as in a pelvic exam. Your health care provider will insert an instrument (speculum) into your vagina to see your cervix. Your cervix will be cleansed with an antiseptic solution. A medicine (local anesthetic) will be used to numb the cervix. A forceps instrument (tenaculum) will be used to hold your cervix steady for the biopsy. A thin, rod-like instrument (uterine sound) will be inserted through your cervix to determine the length of your uterus and the location where the biopsy sample will be removed. A thin, flexible tube (catheter) will be inserted through your cervix and into the uterus. The catheter will be used to collect the biopsy sample from your endometrial tissue. The catheter and speculum will then be removed, and the tissue sample will be sent to a lab for examination. The procedure may vary among health care providers and hospitals. What can I expect after procedure? You will rest in a recovery area until you are ready to go home. You may have mild cramping and a small amount of vaginal bleeding. This is normal. You may have a small amount of vaginal bleeding for a few days. This is normal. It is up to you to get the results of your procedure. Ask your health care provider, or the department that is doing the procedure, when your results will be ready. Follow these instructions at home: Take over-the-counter and prescription medicines only as told by your health care provider. Do not douche, use tampons, or have sexual intercourse until your health care provider approves. Return to your normal activities as told by your health care provider. Ask your health care provider what activities are safe for you. Follow   instructions from your health care provider about any activity restrictions, such as restrictions on strenuous exercise or heavy lifting. Keep all follow-up visits. This is important. Contact a  health care provider: You have heavy bleeding, or bleed for longer than 2 days after the procedure. You have bad smelling discharge from your vagina. You have a fever or chills. You have a burning sensation when urinating or you have difficulty urinating. You have severe pain in your lower abdomen. Get help right away if you: You have severe cramps in your stomach or back. You pass large blood clots. Your bleeding increases. You become weak or light-headed, or you faint or lose consciousness. Summary An endometrial biopsy is a procedure to remove tissue samples is taken from the endometrium, which is the lining of the uterus. The tissue sample that is removed will be checked under a microscope for disease. This procedure is used to diagnose conditions such as endometrial cancer, endometrial tuberculosis, polyps, or other inflammatory conditions. After the procedure, it is common to have mild cramping and a small amount of vaginal bleeding for a few days. Do not douche, use tampons, or have sexual intercourse until your health care provider approves. Ask your health care provider which activities are safe for you. This information is not intended to replace advice given to you by your health care provider. Make sure you discuss any questions you have with your health care provider. Document Revised: 06/18/2021 Document Reviewed: 02/14/2020 Elsevier Patient Education  [place]. Postmenopausal Bleeding Postmenopausal bleeding is any bleeding that occurs after menopause. Menopause is a time in a woman's life when monthly periods stop. Any type of bleeding after menopause should be checked by your doctor. Treatment will depend on the cause. This kind of bleeding can be caused by: Taking hormones during menopause. Low or high amounts of female hormones in the body. This can cause the lining of the womb (uterus) to become too thin or too thick. Cancer. Growths in the womb that are not  cancer. Follow these instructions at home:  Watch for any changes in your symptoms. Let your doctor know about them. Avoid using tampons and douches as told by your doctor. Change your pads regularly. Get regular pelvic exams. This includes Pap tests. Take iron pills as told by your doctor. Take over-the-counter and prescription medicines only as told by your doctor. Keep all follow-up visits. Contact a doctor if: You have new bleeding from the vagina after menopause. You have pain in your belly (abdomen). Get help right away if: You have a fever or chills. You have very bad pain with bleeding. You have clumps of blood (blood clots) coming from your vagina. You have a lot of bleeding, and: You use more than 1 pad an hour. This kind of bleeding has never happened before. You have headaches. You feel dizzy or you feel like you are going to pass out (faint). Summary Any type of bleeding after menopause should be checked by your doctor. Avoid using tampons or douches. Get regular pelvic exams. This includes Pap tests. Contact a doctor if you have new bleeding or pain in your belly. Watch for any changes in your symptoms. Let your doctor know about them. This information is not intended to replace advice given to you by your health care provider. Make sure you discuss any questions you have with your health care provider. Document Revised: 01/06/2020 Document Reviewed: 01/06/2020 Elsevier Patient Education  [place].

## 2022-01-09 NOTE — Progress Notes (Signed)
81 y.o. G44P1011 Widowed Caucasian female here for postmenopausal bleeding.  Her daughter is present for the entire visit today.  Patient's usual gynecologist, Dr. Roosevelt Locks, in [place] is retiring, and she wishes to establish care here.  She was having regular appointments.  She experienced spontaneous large volume of vaginal bleeding 01/04/22.  She noted some back pain prior to the bleeding.  Hx back issues.  LMP was 1997.  She had polyp removal at [facility] 5 years ago.   No HRT.   Takes ASA 81 mg daily.   She is doing iron infusions.  Long standing anemia. Hgb 9.1 on 01/04/22.  She reports she has done endoscopies and colonoscopies which have been normal.  Seen in [facility] 01/04/22.  Pelvic US: US PELVIS TRANSVAGINAL WITH DOPPLER  Narrative:  Pelvic ultrasound.  HISTORY: Vaginal bleeding.  Real-time grayscale and color Doppler transvaginal imaging of the pelvis was performed.  The uterus measures 7.4 x 5.6 x 6.6 cm. There are several heterogeneous lesion in the myometrium the largest in the fundus measuring approximately 6.3 x 5.1 x 6.6 cm which may represent a leiomyomatous uterus.  The endometrial stripe is poorly evaluated on this exam due to the presence of multiple leiomyomas.  Neither the right nor the left ovary couldn't be definitively identified.  There is no pelvic free fluid.  Impression:  IMPRESSION:  Leiomyomatous uterus.  Nonvisualization of the ovaries.  No pelvic free fluid.  Electronically Signed by: Erline Levine, MD on 01/04/2022 8:19 PM    PCP:   Harrington Challenger, MD  No LMP recorded. Patient is postmenopausal.           Sexually active: No.  The current method of family planning is post menopausal status.    Exercising: No.   Smoker:  no  Health Maintenance: Pap: ? unsure History of abnormal Pap:  no MMG:  06-2021 Normal BiRADS 1 Colonoscopy: Pt.states Normal 5 yrs ago  BMD: Pt.states  2 yrs  Result  Osteoporosis TDaP:   UTD Gardasil:   no Screening Labs:  Hb today: PCP, Urine today: PCP   reports that she has never smoked. She has never used smokeless tobacco. She reports current alcohol use. She reports that she does not use drugs.  Past Medical History:  Diagnosis Date   Anxiety and depression    Arthritis    "back, arms" (03/13/2018)   Chronic lower back pain    GERD (gastroesophageal reflux disease)    Hyperlipemia    Hypertension    Iron deficiency anemia    "I'm getting iron infusions" (03/13/2018)   Myocardial infarction ([place]) ~ 1996   "little one"   Pneumonia ~ 1998 X 1   Type II diabetes mellitus ([place])     Past Surgical History:  Procedure Laterality Date   BUNIONECTOMY WITH HAMMERTOE RECONSTRUCTION Left    CATARACT EXTRACTION, BILATERAL Bilateral    DILATION AND CURETTAGE OF UTERUS     EYE SURGERY Bilateral    "after cataract OR"   FRACTURE SURGERY     PERCUTANEOUS PINNING PHALANX FRACTURE OF HAND Right 2017   "took pins out after ~ 6 wks"   TONSILLECTOMY     TUMOR EXCISION  1970s X 2   "off my back"   TUMOR EXCISION Left    "jaw"    Current Outpatient Medications  Medication Sig Dispense Refill   ALPRAZolam (XANAX) 0.5 MG tablet Take by mouth.     aspirin 81 MG chewable tablet Chew by mouth daily.  Blood Glucose Monitoring Suppl (ONETOUCH VERIO) w/Device KIT by Does not apply route.     gabapentin (NEURONTIN) 300 MG capsule TAKE 1 CAPSULE(300 MG) BY MOUTH THREE TIMES DAILY     glipiZIDE (GLUCOTROL XL) 10 MG 24 hr tablet Take 10 mg by mouth daily.     Insulin Lispro Prot & Lispro (HUMALOG 75/25 MIX) (75-25) 100 UNIT/ML Kwikpen Inject 16 units ac breakfast 2 units ac lunch and 5-7 units ac supper  DX E11.8 and Z79.4     Insulin Pen Needle (BD PEN NEEDLE NANO 2ND GEN) 32G X 4 MM MISC Use with insulin twice daily and Trulicity once weekly  DX E11.8 and Z79.4     lisinopril-hydrochlorothiazide (PRINZIDE,ZESTORETIC) 20-12.5 MG tablet Take 1 tablet by mouth daily.  0   meloxicam  (MOBIC) 7.5 MG tablet Take 7.5 mg by mouth daily.     metoprolol (TOPROL-XL) 100 MG 24 hr tablet Take 100 mg by mouth daily.       omeprazole (PRILOSEC) 20 MG capsule Take 20 mg by mouth daily.       prochlorperazine (COMPAZINE) 10 MG tablet Take by mouth.     Propylene Glycol 0.6 % SOLN Apply 1 drop to eye as needed (dry eyes).     sertraline (ZOLOFT) 100 MG tablet Take 100 mg by mouth daily.       simvastatin (ZOCOR) 40 MG tablet Take 40 mg by mouth daily.      TRULICITY 1.54 MG/8.6PY SOPN Inject 0.75 mg into the skin once a week.     Vitamin D, Ergocalciferol, (DRISDOL) 1.25 MG (50000 UNIT) CAPS capsule Take 50,000 Units by mouth once a week.     denosumab (PROLIA) 60 MG/ML SOSY injection Inject into the skin. (Patient not taking: Reported on 01/09/2022)     ondansetron (ZOFRAN-ODT) 4 MG disintegrating tablet Take 4 mg by mouth every 8 (eight) hours as needed for nausea. (Patient not taking: Reported on 01/09/2022)  0   No current facility-administered medications for this visit.    Family History  Problem Relation Age of Onset   Stroke Mother    Diabetes Mother    Heart disease Mother    Stroke Father    Heart disease Brother    Stroke Brother    Heart disease Brother     Review of Systems  Genitourinary:        PMB   Exam:   BP 120/74 (BP Location: Right Arm, Patient Position: Supine, Cuff Size: Normal)   Pulse 84   Ht _0  (1.6 m)   Wt 175 lb (79.4 kg)   SpO2 100%   BMI 31.00 kg/m     General appearance: alert, cooperative and appears stated age Head: normocephalic, without obvious abnormality, atraumatic Lungs: clear to auscultation bilaterally Heart: regular rate and rhythm Abdomen: soft, non-tender; no masses, no organomegaly Extremities: extremities normal, atraumatic, no cyanosis or edema Skin: skin color, texture, turgor normal. No rashes or lesions  Pelvic: External genitalia:  no lesions              No abnormal inguinal nodes palpated.              Urethra:   normal appearing urethra with no masses, tenderness or lesions              Bartholins and Skenes: normal                 Vagina: normal appearing vagina with normal color and discharge, no  lesions              Cervix: no lesions              Pap taken: yes Bimanual Exam:  Uterus:  6 week size.               Adnexa: no mass, fullness, tenderness              Rectal exam: yes.  Confirms.              Anus:  normal sphincter tone, no lesions  Chaperone was present for exam:  Maudie Mercury, CMA  Assessment:   Postmenopausal bleeding.  Possible uterine fibroids.  Hx anemia.   Plan: Discussed postmenopausal bleeding and possible etiologies:  polyps, infection, precancer and cancer.  Pap and HR HPV collected.  Return for EMB.  Procedure and rationale explained.  Final plan to follow after pap and endometrial biopsy reports are finalized.   After visit summary provided.   34 min  total time was spent for this patient encounter, including preparation, face-to-face counseling with the patient, coordination of care, and documentation of the encounter.

## 2022-01-10 NOTE — Progress Notes (Unsigned)
GYNECOLOGY  VISIT   HPI: 81 y.o.   Widowed  Caucasian  female   G2P0011 with No LMP recorded. Patient is postmenopausal.   here for endometrial biopsy. Reports no more bleeding since last time.  GYNECOLOGIC HISTORY: No LMP recorded. Patient is postmenopausal. Contraception:  PMP Menopausal hormone therapy: none Last mammogram:  06/2021 Neg/Birads1 Last pap smear:   01-09-22 WNL, HPV- neg        OB History     Gravida  2   Para  1   Term      Preterm      AB  1   Living  1      SAB  1   IAB      Ectopic      Multiple      Live Births                 Patient Active Problem List   Diagnosis Date Noted   Thrombocytopenia ([place]) 03/14/2018   Abscess of buttock, right 03/14/2018   Cellulitis and abscess of buttock 03/13/2018   AKI (acute kidney injury) ([place]) 03/13/2018   Diabetes mellitus type 2, uncontrolled 03/13/2018   Anemia in other chronic diseases classified elsewhere 03/13/2018   Lumbosacral strain 06/19/2016   Trochanteric bursitis 06/19/2016    Past Medical History:  Diagnosis Date   Anxiety and depression    Arthritis    "back, arms" (03/13/2018)   Chronic lower back pain    GERD (gastroesophageal reflux disease)    Hyperlipemia    Hypertension    Iron deficiency anemia    "I'm getting iron infusions" (03/13/2018)   Myocardial infarction ([place]) ~ 1996   "little one"   Pneumonia ~ 1998 X 1   Type II diabetes mellitus ([place])     Past Surgical History:  Procedure Laterality Date   BUNIONECTOMY WITH HAMMERTOE RECONSTRUCTION Left    CATARACT EXTRACTION, BILATERAL Bilateral    DILATION AND CURETTAGE OF UTERUS     EYE SURGERY Bilateral    "after cataract OR"   FRACTURE SURGERY     PERCUTANEOUS PINNING PHALANX FRACTURE OF HAND Right 2017   "took pins out after ~ 6 wks"   TONSILLECTOMY     TUMOR EXCISION  1970s X 2   "off my back"   TUMOR EXCISION Left    "jaw"    Current Outpatient Medications  Medication Sig Dispense Refill    ALPRAZolam (XANAX) 0.5 MG tablet Take by mouth.     aspirin 81 MG chewable tablet Chew by mouth daily.     Blood Glucose Monitoring Suppl (ONETOUCH VERIO) w/Device KIT by Does not apply route.     denosumab (PROLIA) 60 MG/ML SOSY injection Inject into the skin.     gabapentin (NEURONTIN) 300 MG capsule TAKE 1 CAPSULE(300 MG) BY MOUTH THREE TIMES DAILY     glipiZIDE (GLUCOTROL XL) 10 MG 24 hr tablet Take 10 mg by mouth daily.     Insulin Lispro Prot & Lispro (HUMALOG 75/25 MIX) (75-25) 100 UNIT/ML Kwikpen Inject 16 units ac breakfast 2 units ac lunch and 5-7 units ac supper  DX E11.8 and Z79.4     Insulin Pen Needle (BD PEN NEEDLE NANO 2ND GEN) 32G X 4 MM MISC Use with insulin twice daily and Trulicity once weekly  DX E11.8 and Z79.4     lisinopril-hydrochlorothiazide (PRINZIDE,ZESTORETIC) 20-12.5 MG tablet Take 1 tablet by mouth daily.  0   meloxicam (MOBIC) 7.5 MG tablet Take 7.5 mg  by mouth daily.     metoprolol (TOPROL-XL) 100 MG 24 hr tablet Take 100 mg by mouth daily.       omeprazole (PRILOSEC) 20 MG capsule Take 20 mg by mouth daily.       ondansetron (ZOFRAN-ODT) 4 MG disintegrating tablet Take 4 mg by mouth every 8 (eight) hours as needed for nausea.  0   prochlorperazine (COMPAZINE) 10 MG tablet Take by mouth.     Propylene Glycol 0.6 % SOLN Apply 1 drop to eye as needed (dry eyes).     sertraline (ZOLOFT) 100 MG tablet Take 100 mg by mouth daily.       simvastatin (ZOCOR) 40 MG tablet Take 40 mg by mouth daily.      TRULICITY 2.42 PN/3.6RW SOPN Inject 0.75 mg into the skin once a week.     Vitamin D, Ergocalciferol, (DRISDOL) 1.25 MG (50000 UNIT) CAPS capsule Take 50,000 Units by mouth once a week.     No current facility-administered medications for this visit.     ALLERGIES: Acetaminophen and Percocet [oxycodone-acetaminophen]  Family History  Problem Relation Age of Onset   Stroke Mother    Diabetes Mother    Heart disease Mother    Stroke Father    Heart disease Brother     Stroke Brother    Heart disease Brother     Social History   Socioeconomic History   Marital status: Widowed    Spouse name: Not on file   Number of children: Not on file   Years of education: Not on file   Highest education level: Not on file  Occupational History   Not on file  Tobacco Use   Smoking status: Never   Smokeless tobacco: Never  Vaping Use   Vaping Use: Never used  Substance and Sexual Activity   Alcohol use: Yes    Comment: Rare   Drug use: Never   Sexual activity: Not Currently  Other Topics Concern   Not on file  Social History Narrative   Not on file   Social Determinants of Health   Financial Resource Strain: Not on file  Food Insecurity: Not on file  Transportation Needs: Not on file  Physical Activity: Not on file  Stress: Not on file  Social Connections: Not on file  Intimate Partner Violence: Not on file    Review of Systems  All other systems reviewed and are negative.   PHYSICAL EXAMINATION:    BP 122/76   Pulse 64   SpO2 96%     General appearance: alert, cooperative and appears stated age   EMB Consent done.  Sterile prep with Hibiclens.  Tenaculum to anterior cervical lip.  Paracervical block with 10 cc 1% lidocaine, lot [id number], exp 03/2024.  Os finder used.  Pipelle passed to 7 cm x 2.  Tissue to pathology.  No complications.  Minimal EBL.  Chaperone was present for exam:  Lovena Le, CMA  ASSESSMENT  Postmenopausal bleeding.  Fibroids.   PLAN  Normal pap result discussed.  Fu EMB.  Final plan to follow.  Will get a copy of her prior hysteroscopy/dilation and curettage surgery and pathology report.    An After Visit Summary was printed and given to the patient.

## 2022-01-14 ENCOUNTER — Ambulatory Visit (INDEPENDENT_AMBULATORY_CARE_PROVIDER_SITE_OTHER): Payer: Medicare Other | Admitting: Obstetrics and Gynecology

## 2022-01-14 ENCOUNTER — Encounter: Payer: Self-pay | Admitting: Obstetrics and Gynecology

## 2022-01-14 ENCOUNTER — Other Ambulatory Visit (HOSPITAL_COMMUNITY)
Admission: RE | Admit: 2022-01-14 | Discharge: 2022-01-14 | Disposition: A | Discharge status: Home / Self Care | Payer: Medicare Other | Source: Ambulatory Visit | Attending: Obstetrics and Gynecology | Admitting: Obstetrics and Gynecology

## 2022-01-14 DIAGNOSIS — N950 Postmenopausal bleeding: Secondary | ICD-10-CM | POA: Diagnosis present

## 2022-01-14 LAB — CYTOLOGY - PAP
Comment: NEGATIVE
Diagnosis: NEGATIVE
Diagnosis: REACTIVE
High risk HPV: NEGATIVE

## 2022-01-14 NOTE — Patient Instructions (Signed)
Endometrial Biopsy  An endometrial biopsy is a procedure to remove tissue samples from the endometrium, which is the lining of the uterus. The tissue that is removed can then be checked under a microscope for disease. This procedure is used to diagnose conditions such as endometrial cancer, endometrial tuberculosis, polyps, or other inflammatory conditions. This procedure may also be used to investigate uterine bleeding to determine where you are in your menstrual cycle or how your hormone levels are affecting the lining of the uterus. Tell a health care provider about: Any allergies you have. All medicines you are taking, including vitamins, herbs, eye drops, creams, and over-the-counter medicines. Any problems you or family members have had with anesthetic medicines. Any blood disorders you have. Any surgeries you have had. Any medical conditions you have. Whether you are pregnant or may be pregnant. What are the risks? Generally, this is a safe procedure. However, problems may occur, including: Bleeding. Pelvic infection. Puncture of the wall of the uterus with the biopsy device (rare). Allergic reactions to medicines. What happens before the procedure? Keep a record of your menstrual cycles as told by your health care provider. You may need to schedule your procedure for a specific time in your cycle. You may want to bring a sanitary pad to wear after the procedure. Plan to have someone take you home from the hospital or clinic. Ask your health care provider about: Changing or stopping your regular medicines. This is especially important if you are taking diabetes medicines, arthritis medicines, or blood thinners. Taking medicines such as aspirin and ibuprofen. These medicines can thin your blood. Do not take these medicines unless your health care provider tells you to take them. Taking over-the-counter medicines, vitamins, herbs, and supplements. What happens during the  procedure? You will lie on an exam table with your feet and legs supported as in a pelvic exam. Your health care provider will insert an instrument (speculum) into your vagina to see your cervix. Your cervix will be cleansed with an antiseptic solution. A medicine (local anesthetic) will be used to numb the cervix. A forceps instrument (tenaculum) will be used to hold your cervix steady for the biopsy. A thin, rod-like instrument (uterine sound) will be inserted through your cervix to determine the length of your uterus and the location where the biopsy sample will be removed. A thin, flexible tube (catheter) will be inserted through your cervix and into the uterus. The catheter will be used to collect the biopsy sample from your endometrial tissue. The catheter and speculum will then be removed, and the tissue sample will be sent to a lab for examination. The procedure may vary among health care providers and hospitals. What can I expect after procedure? You will rest in a recovery area until you are ready to go home. You may have mild cramping and a small amount of vaginal bleeding. This is normal. You may have a small amount of vaginal bleeding for a few days. This is normal. It is up to you to get the results of your procedure. Ask your health care provider, or the department that is doing the procedure, when your results will be ready. Follow these instructions at home: Take over-the-counter and prescription medicines only as told by your health care provider. Do not douche, use tampons, or have sexual intercourse until your health care provider approves. Return to your normal activities as told by your health care provider. Ask your health care provider what activities are safe for you. Follow   instructions from your health care provider about any activity restrictions, such as restrictions on strenuous exercise or heavy lifting. Keep all follow-up visits. This is important. Contact a  health care provider: You have heavy bleeding, or bleed for longer than 2 days after the procedure. You have bad smelling discharge from your vagina. You have a fever or chills. You have a burning sensation when urinating or you have difficulty urinating. You have severe pain in your lower abdomen. Get help right away if you: You have severe cramps in your stomach or back. You pass large blood clots. Your bleeding increases. You become weak or light-headed, or you faint or lose consciousness. Summary An endometrial biopsy is a procedure to remove tissue samples is taken from the endometrium, which is the lining of the uterus. The tissue sample that is removed will be checked under a microscope for disease. This procedure is used to diagnose conditions such as endometrial cancer, endometrial tuberculosis, polyps, or other inflammatory conditions. After the procedure, it is common to have mild cramping and a small amount of vaginal bleeding for a few days. Do not douche, use tampons, or have sexual intercourse until your health care provider approves. Ask your health care provider which activities are safe for you. This information is not intended to replace advice given to you by your health care provider. Make sure you discuss any questions you have with your health care provider. Document Revised: 06/18/2021 Document Reviewed: 02/14/2020 Elsevier Patient Education  2023 [employer]vier Inc.  

## 2022-01-14 NOTE — Addendum Note (Signed)
Addended by: Yisroel Ramming, Tyeshia Cornforth E on: 01/14/2022 11:59 AM   Modules accepted: Orders

## 2022-01-16 LAB — SURGICAL PATHOLOGY

## 2022-01-17 ENCOUNTER — Other Ambulatory Visit: Payer: Self-pay

## 2022-01-17 DIAGNOSIS — N950 Postmenopausal bleeding: Secondary | ICD-10-CM

## 2022-03-05 NOTE — Progress Notes (Signed)
GYNECOLOGY  VISIT   HPI: 81 y.o.   Widowed  Caucasian  female   [id number] with No LMP recorded. Patient is postmenopausal.   here for sonohysterogram.     Daughter is present in the room for the sonohysterogram and for the office visit after this.   Patient had postmenopausal bleeding. Patient had prior pelvic US, and her endometrium was obscured.   Her endometrial biopsy showed inactive endometrium negative for hyperplasia and malignancy.   Known history of fibroids.  GYNECOLOGIC HISTORY: No LMP recorded. Patient is postmenopausal. Contraception:  PMP Menopausal hormone therapy:  none Last mammogram:   06/2021 Neg/Birads1 Last pap smear: 01-09-22 Neg:Neg HR HPv        OB History     Gravida  2   Para  1   Term      Preterm      AB  1   Living  1      SAB  1   IAB      Ectopic      Multiple      Live Births                 Patient Active Problem List   Diagnosis Date Noted   Thrombocytopenia ([place]) 03/14/2018   Abscess of buttock, right 03/14/2018   Cellulitis and abscess of buttock 03/13/2018   AKI (acute kidney injury) ([place]) 03/13/2018   Diabetes mellitus type 2, uncontrolled 03/13/2018   Anemia in other chronic diseases classified elsewhere 03/13/2018   Lumbosacral strain 06/19/2016   Trochanteric bursitis 06/19/2016    Past Medical History:  Diagnosis Date   Anxiety and depression    Arthritis    "back, arms" (03/13/2018)   Chronic lower back pain    GERD (gastroesophageal reflux disease)    Hyperlipemia    Hypertension    Iron deficiency anemia    "I'm getting iron infusions" (03/13/2018)   Myocardial infarction ([place]) ~ 1996   "little one"   Pneumonia ~ 1998 X 1   Type II diabetes mellitus ([place])     Past Surgical History:  Procedure Laterality Date   BUNIONECTOMY WITH HAMMERTOE RECONSTRUCTION Left    CATARACT EXTRACTION, BILATERAL Bilateral    DILATION AND CURETTAGE OF UTERUS     EYE SURGERY Bilateral    "after cataract OR"    FRACTURE SURGERY     PERCUTANEOUS PINNING PHALANX FRACTURE OF HAND Right 2017   "took pins out after ~ 6 wks"   TONSILLECTOMY     TUMOR EXCISION  1970s X 2   "off my back"   TUMOR EXCISION Left    "jaw"    Current Outpatient Medications  Medication Sig Dispense Refill   ALPRAZolam (XANAX) 0.5 MG tablet Take by mouth.     aspirin 81 MG chewable tablet Chew by mouth daily.     Blood Glucose Monitoring Suppl (ONETOUCH VERIO) w/Device KIT by Does not apply route.     denosumab (PROLIA) 60 MG/ML SOSY injection Inject into the skin.     gabapentin (NEURONTIN) 300 MG capsule TAKE 1 CAPSULE(300 MG) BY MOUTH THREE TIMES DAILY     glipiZIDE (GLUCOTROL XL) 10 MG 24 hr tablet Take 10 mg by mouth daily.     Insulin Lispro Prot & Lispro (HUMALOG 75/25 MIX) (75-25) 100 UNIT/ML Kwikpen Inject 16 units ac breakfast 2 units ac lunch and 5-7 units ac supper  DX E11.8 and Z79.4     Insulin Pen Needle (BD PEN NEEDLE NANO 2ND  GEN) 32G X 4 MM MISC Use with insulin twice daily and Trulicity once weekly  DX E11.8 and Z79.4     lisinopril-hydrochlorothiazide (PRINZIDE,ZESTORETIC) 20-12.5 MG tablet Take 1 tablet by mouth daily.  0   meloxicam (MOBIC) 7.5 MG tablet Take 7.5 mg by mouth daily.     metoprolol (TOPROL-XL) 100 MG 24 hr tablet Take 100 mg by mouth daily.       omeprazole (PRILOSEC) 20 MG capsule Take 20 mg by mouth daily.       ondansetron (ZOFRAN-ODT) 4 MG disintegrating tablet Take 4 mg by mouth every 8 (eight) hours as needed for nausea.  0   prochlorperazine (COMPAZINE) 10 MG tablet Take by mouth.     Propylene Glycol 0.6 % SOLN Apply 1 drop to eye as needed (dry eyes).     sertraline (ZOLOFT) 100 MG tablet Take 100 mg by mouth daily.       simvastatin (ZOCOR) 40 MG tablet Take 40 mg by mouth daily.      TRULICITY 7.12 WP/8.0DX SOPN Inject 0.75 mg into the skin once a week.     Vitamin D, Ergocalciferol, (DRISDOL) 1.25 MG (50000 UNIT) CAPS capsule Take 50,000 Units by mouth once a week.     No  current facility-administered medications for this visit.     ALLERGIES: Acetaminophen and Percocet [oxycodone-acetaminophen]  Family History  Problem Relation Age of Onset   Stroke Mother    Diabetes Mother    Heart disease Mother    Stroke Father    Heart disease Brother    Stroke Brother    Heart disease Brother     Social History   Socioeconomic History   Marital status: Widowed    Spouse name: Not on file   Number of children: Not on file   Years of education: Not on file   Highest education level: Not on file  Occupational History   Not on file  Tobacco Use   Smoking status: Never   Smokeless tobacco: Never  Vaping Use   Vaping Use: Never used  Substance and Sexual Activity   Alcohol use: Yes    Comment: Rare   Drug use: Never   Sexual activity: Not Currently  Other Topics Concern   Not on file  Social History Narrative   Not on file   Social Determinants of Health   Financial Resource Strain: Not on file  Food Insecurity: Not on file  Transportation Needs: Not on file  Physical Activity: Not on file  Stress: Not on file  Social Connections: Not on file  Intimate Partner Violence: Not on file    Review of Systems  See HPI.   PHYSICAL EXAMINATION:    BP 138/76   Pulse 67   Ht 5' 3"  (1.6 m)   Wt 175 lb (79.4 kg)   SpO2 100%   BMI 31.00 kg/m     General appearance: alert, cooperative and appears stated age   Pelvic US Uterus with 2 fibroids:   1 - 6.6 cm posterior/fundal intramural and partially submucosal compressing the endometrial canal.  2 - 1.95 cm.   EMS 3.43 mm.   Sonohysterogram Sterile prep with betadine.  Cannula passed and sterile saline fluid placed inside the endometrial canal.  Partially submucous fibroid noted and no evidence of polyps.  No complications.  No EBL.   Chaperone was present for exam:  Caryl Pina, RDMS.  ASSESSMENT  Postmenopausal bleeding.  Fibroids.   PLAN  Sonohysterogram findings and endometrial  biopsy reviewed.  Get prior records to compare images of her fibroids.  Will consider pelvic MRI.   Needs open MRI if this will be done.   An After Visit Summary was printed and given to the patient.  35 min  total time was spent for this patient encounter, including preparation, face-to-face counseling with the patient, coordination of care, and documentation of the encounter.

## 2022-03-07 ENCOUNTER — Ambulatory Visit (INDEPENDENT_AMBULATORY_CARE_PROVIDER_SITE_OTHER): Payer: Medicare Other

## 2022-03-07 ENCOUNTER — Ambulatory Visit (INDEPENDENT_AMBULATORY_CARE_PROVIDER_SITE_OTHER): Payer: Medicare Other | Admitting: Obstetrics and Gynecology

## 2022-03-07 VITALS — BP 138/76 | HR 67 | Ht 63.0 in | Wt 175.0 lb

## 2022-03-07 DIAGNOSIS — D219 Benign neoplasm of connective and other soft tissue, unspecified: Secondary | ICD-10-CM | POA: Diagnosis not present

## 2022-03-07 DIAGNOSIS — N950 Postmenopausal bleeding: Secondary | ICD-10-CM

## 2022-03-12 ENCOUNTER — Encounter: Payer: Self-pay | Admitting: Obstetrics and Gynecology

## 2022-03-14 ENCOUNTER — Telehealth: Payer: Self-pay | Admitting: Obstetrics and Gynecology

## 2022-03-14 DIAGNOSIS — D219 Benign neoplasm of connective and other soft tissue, unspecified: Secondary | ICD-10-CM

## 2022-03-14 DIAGNOSIS — N950 Postmenopausal bleeding: Secondary | ICD-10-CM

## 2022-03-14 NOTE — Telephone Encounter (Signed)
Please contact patient in follow up to her sonohysterogram appointment for postmenopausal bleeding.   I did receive records regarding her prior ultrasound at [facility], which did show 2 fibroids.  One fibroid measured 5.75 cm and one measured 4.0 cm.  On our ultrasound, the largest fibroid we measured was 6.6 cm and the small one measured 1.95 cm.   We will proceed with a pelvic MRI due to the enlarging fibroid and the postmenopausal bleeding she had.   Please order and schedule this for my patient.

## 2022-03-14 NOTE — Telephone Encounter (Signed)
Spoke with patient. I read her Dr. Elza Rafter message .  I have placed order for pelvic MRI. I provided patient with the phone number to schedule as they have screening questions to review with her prior to scheduling. She will call to schedule. I will monitor for date so I can forward to KAB to check on ins PA.

## 2022-03-18 NOTE — Telephone Encounter (Signed)
Mri scheduled on 03/26/22.

## 2022-03-19 NOTE — Telephone Encounter (Signed)
Thank you for the update!

## 2022-03-26 ENCOUNTER — Ambulatory Visit
Admission: RE | Admit: 2022-03-26 | Discharge: 2022-03-26 | Disposition: A | Discharge status: Home / Self Care | Payer: Medicare Other | Source: Ambulatory Visit | Attending: Obstetrics and Gynecology | Admitting: Obstetrics and Gynecology

## 2022-03-26 DIAGNOSIS — D219 Benign neoplasm of connective and other soft tissue, unspecified: Secondary | ICD-10-CM

## 2022-03-26 DIAGNOSIS — N950 Postmenopausal bleeding: Secondary | ICD-10-CM

## 2022-03-26 MED ORDER — GADOBENATE DIMEGLUMINE 529 MG/ML IV SOLN
16.0000 mL | Freq: Once | INTRAVENOUS | Status: AC | PRN
Start: 2022-03-26 — End: 2022-03-26
  Administered 2022-03-26: 16 mL via INTRAVENOUS

## 2022-08-05 DIAGNOSIS — D259 Leiomyoma of uterus, unspecified: Secondary | ICD-10-CM

## 2022-08-05 HISTORY — DX: Leiomyoma of uterus, unspecified: D25.9

## 2022-11-08 ENCOUNTER — Ambulatory Visit
Admission: EM | Admit: 2022-11-08 | Discharge: 2022-11-08 | Disposition: A | Discharge status: Home / Self Care | Payer: Medicare Other

## 2022-11-08 DIAGNOSIS — H9203 Otalgia, bilateral: Secondary | ICD-10-CM

## 2022-11-08 DIAGNOSIS — H6123 Impacted cerumen, bilateral: Secondary | ICD-10-CM

## 2022-11-08 NOTE — Discharge Instructions (Addendum)
Advised patient from using OTC Q-tips when cleaning ears.  Advised if symptoms worsen and/or unresolved please follow-up with ENT or here for further evaluation.

## 2022-11-08 NOTE — ED Provider Notes (Signed)
[facility]    CSN: [id number] Arrival date & time: 11/08/22  1130      History   Chief Complaint Chief Complaint  Patient presents with   Otalgia    LT    HPI Amber Gentry is a 82 y.o. female.   HPI Very pleasant 82 year old female presents with left ear pain x 2 weeks.  Patient reports fullness of both ears for 2 weeks.  PMH significant for obesity, T2DM, HTN, and chronic low back pain  Past Medical History:  Diagnosis Date   Anxiety and depression    Arthritis    "back, arms" (03/13/2018)   Chronic lower back pain    GERD (gastroesophageal reflux disease)    Hyperlipemia    Hypertension    Iron deficiency anemia    "I'm getting iron infusions" (03/13/2018)   Myocardial infarction ~ 1996   "little one"   Pneumonia ~ 1998 X 1   Type II diabetes mellitus     Patient Active Problem List   Diagnosis Date Noted   Thrombocytopenia 03/14/2018   Abscess of buttock, right 03/14/2018   Cellulitis and abscess of buttock 03/13/2018   AKI (acute kidney injury) 03/13/2018   Diabetes mellitus type 2, uncontrolled 03/13/2018   Anemia in other chronic diseases classified elsewhere 03/13/2018   Lumbosacral strain 06/19/2016   Trochanteric bursitis 06/19/2016    Past Surgical History:  Procedure Laterality Date   BUNIONECTOMY WITH HAMMERTOE RECONSTRUCTION Left    CATARACT EXTRACTION, BILATERAL Bilateral    DILATION AND CURETTAGE OF UTERUS     EYE SURGERY Bilateral    "after cataract OR"   FRACTURE SURGERY     PERCUTANEOUS PINNING PHALANX FRACTURE OF HAND Right 2017   "took pins out after ~ 6 wks"   TONSILLECTOMY     TUMOR EXCISION  1970s X 2   "off my back"   TUMOR EXCISION Left    "jaw"    OB History     Gravida  2   Para  1   Term      Preterm      AB  1   Living  1      SAB  1   IAB      Ectopic      Multiple      Live Births               Home Medications    Prior to Admission medications   Medication Sig Start Date  End Date Taking? Authorizing Provider  ALPRAZolam Prudy Feeler(XANAX) 0.5 MG tablet Take by mouth. 10/02/20   [provider]  aspirin 81 MG chewable tablet Chew by mouth daily.    [provider]  Blood Glucose Monitoring Suppl (ONETOUCH VERIO) w/Device KIT by Does not apply route. 06/19/18   [provider]  denosumab (PROLIA) 60 MG/ML SOSY injection Inject into the skin. 02/04/20   [provider]  gabapentin (NEURONTIN) 300 MG capsule TAKE 1 CAPSULE(300 MG) BY MOUTH THREE TIMES DAILY 08/15/20   [provider]  glipiZIDE (GLUCOTROL XL) 10 MG 24 hr tablet Take 10 mg by mouth daily. 09/07/17   [provider]  Insulin Lispro Prot & Lispro (HUMALOG 75/25 MIX) (75-25) 100 UNIT/ML Kwikpen Inject 16 units ac breakfast 2 units ac lunch and 5-7 units ac supper  DX E11.8 and Z79.4 07/03/20   [provider]  Insulin Pen Needle (BD PEN NEEDLE NANO 2ND GEN) 32G X 4 MM MISC Use with insulin  twice daily and Trulicity once weekly  DX E11.8 and Z79.4 05/30/20   [provider]  lisinopril-hydrochlorothiazide (PRINZIDE,ZESTORETIC) 20-12.5 MG tablet Take 1 tablet by mouth daily. 01/11/18   [provider]  meloxicam (MOBIC) 7.5 MG tablet Take 7.5 mg by mouth daily. 08/16/20   [provider]  metoprolol (TOPROL-XL) 100 MG 24 hr tablet Take 100 mg by mouth daily.      [provider]  omeprazole (PRILOSEC) 20 MG capsule Take 20 mg by mouth daily.      [provider]  ondansetron (ZOFRAN-ODT) 4 MG disintegrating tablet Take 4 mg by mouth every 8 (eight) hours as needed for nausea. 03/11/18   [provider]  prochlorperazine (COMPAZINE) 10 MG tablet Take by mouth. 01/18/20   [provider]  Propylene Glycol 0.6 % SOLN Apply 1 drop to eye as needed (dry eyes).    [provider]  sertraline (ZOLOFT) 100 MG tablet Take 100 mg by mouth daily.      [provider]  simvastatin (ZOCOR) 40 MG tablet  Take 40 mg by mouth daily.     [provider]  TRULICITY 0.75 MG/0.5ML SOPN Inject 0.75 mg into the skin once a week. 08/24/20   [provider]  Vitamin D, Ergocalciferol, (DRISDOL) 1.25 MG (50000 UNIT) CAPS capsule Take 50,000 Units by mouth once a week. 08/01/20   [provider]    Family History Family History  Problem Relation Age of Onset   Stroke Mother    Diabetes Mother    Heart disease Mother    Stroke Father    Heart disease Brother    Stroke Brother    Heart disease Brother     Social History Social History   Tobacco Use   Smoking status: Never   Smokeless tobacco: Never  Vaping Use   Vaping Use: Never used  Substance Use Topics   Alcohol use: Yes    Comment: Rare   Drug use: Never     Allergies   Acetaminophen and Percocet [oxycodone-acetaminophen]   Review of Systems Review of Systems  HENT:  Positive for ear pain.        Left ear pain and fullness of both ears for 2 weeks  All other systems reviewed and are negative.    Physical Exam Triage Vital Signs ED Triage Vitals  Enc Vitals Group     BP      Pulse      Resp      Temp      Temp src      SpO2      Weight      Height      Head Circumference      Peak Flow      Pain Score      Pain Loc      Pain Edu?      Excl. in GC?    No data found.  Updated Vital Signs BP 102/61 (BP Location: Left Arm)   Pulse 62   Temp 97.9 F (36.6 C) (Oral)   Resp 17   SpO2 100%    Physical Exam Vitals reviewed.  Constitutional:      Appearance: Normal appearance. She is normal weight.  HENT:     Head: Normocephalic and atraumatic.     Right Ear: External ear normal.     Left Ear: External ear normal.     Ears:     Comments: Bilateral EAC's occluded with cerumen  unable to visualize either TM; post bilateral ear lavage: EAC's are clear bilaterally; left TM-clear, mildly retracted, with good light reflex; right TM clear, with good light reflex    Mouth/Throat:      Mouth: Mucous membranes are moist.     Pharynx: Oropharynx is clear.  Eyes:     Extraocular Movements: Extraocular movements intact.     Conjunctiva/sclera: Conjunctivae normal.     Pupils: Pupils are equal, round, and reactive to light.  Cardiovascular:     Rate and Rhythm: Normal rate and regular rhythm.     Pulses: Normal pulses.     Heart sounds: Normal heart sounds.  Pulmonary:     Effort: Pulmonary effort is normal.     Breath sounds: Normal breath sounds. No wheezing, rhonchi or rales.  Musculoskeletal:        General: Normal range of motion.     Cervical back: Normal range of motion and neck supple.  Skin:    General: Skin is warm and dry.  Neurological:     General: No focal deficit present.     Mental Status: She is alert and oriented to person, place, and time. Mental status is at baseline.  Psychiatric:        Mood and Affect: Mood normal.        Behavior: Behavior normal.        Thought Content: Thought content normal.      UC Treatments / Results  Labs (all labs ordered are listed, but only abnormal results are displayed) Labs Reviewed - No data to display  EKG   Radiology No results found.  Procedures Procedures (including critical care time)  Medications Ordered in UC Medications - No data to display  Initial Impression / Assessment and Plan / UC Course  I have reviewed the triage vital signs and the nursing notes.  Pertinent labs & imaging results that were available during my care of the patient were reviewed by me and considered in my medical decision making (see chart for details).     MDM: 1.  Bilateral impacted cerumen-resolved with bilateral ear lavage. Advised patient from using OTC Q-tips when cleaning ears.  Advised if symptoms worsen and/or unresolved please follow-up with ENT or here for further evaluation.  Discharged home, hemodynamically stable. Final Clinical Impressions(s) / UC Diagnoses   Final diagnoses:  Bilateral impacted  cerumen     Discharge Instructions      Advised patient from using OTC Q-tips when cleaning ears.  Advised if symptoms worsen and/or unresolved please follow-up with ENT or here for further evaluation.     ED Prescriptions   None    PDMP not reviewed this encounter.   Trevor Iha, FNP 11/08/22 1244

## 2022-11-08 NOTE — ED Triage Notes (Signed)
Pt c/o LT ear pain x 2 weeks.

## 2023-01-07 ENCOUNTER — Ambulatory Visit (INDEPENDENT_AMBULATORY_CARE_PROVIDER_SITE_OTHER): Payer: Medicare Other | Admitting: Obstetrics and Gynecology

## 2023-01-07 ENCOUNTER — Encounter: Payer: Self-pay | Admitting: Obstetrics and Gynecology

## 2023-01-07 VITALS — BP 116/68 | HR 62 | Ht 63.0 in | Wt 159.0 lb

## 2023-01-07 DIAGNOSIS — D3911 Neoplasm of uncertain behavior of right ovary: Secondary | ICD-10-CM | POA: Diagnosis not present

## 2023-01-07 DIAGNOSIS — R1900 Intra-abdominal and pelvic swelling, mass and lump, unspecified site: Secondary | ICD-10-CM | POA: Diagnosis not present

## 2023-01-07 NOTE — Progress Notes (Unsigned)
GYNECOLOGY  VISIT   HPI: 82 y.o.   Widowed  Caucasian  female   [id number] with No LMP recorded. Patient is postmenopausal.   here for   2nd opinion ([place] 01/01/23 [facility]-uterine fibroids and cystic mass) Pt also had CT scan 3/15- gallstones, gave referral for U/S due to ovarian cyst.  She is maybe needing her gallbladder removed.   Patient's daughter is present for the visit today.   Patient became ill in March and developed back pain.   She had CT imaging showing gallstones and a 3.9 cm low density area of the right adnexa.     She had a follow up pelvic US 01/01/23 through [facility] showing a slightly complex ovarian cyst.  See report below. She was referred to GYN ONC, but she did not have this consultation yet.   Patient was seen in this office in 2023 for postmenopausal bleeding.  Her endometrial biopsy 01/14/22 showed inactive endometrium.  Pelvic ultrasound with sonohysterogram 03/07/22 showed a 6 cm intramural fibroid that is partially submucosal.   She had a pelvic MRI 03/26/22 done for further definition of her fibroid, and it revealed a 6.9 cm intramural, partially degenerating fibroid.  She also had a 3.4 cm simple right ovarian cyst.   Currently she has a little right lower quadrant pain.  Denies vaginal bleeding.   She has dx of anemia and has been doing iron infusions. Hgb 9.7 on 01/02/23 in [place], [place].  Her pelvic US 01/01/23 that is prompting this visit is noted below.  US Pelvis Transabdominal Transvaginal Without Doppler  Anatomical Region Laterality Modality  Pelvis -- Ultrasound  Vascular -- --   Impression  IMPRESSION: 1.  4 x 2.8 x 2.6 cm right adnexal cystic mass at least mildly complex. Correlate with clinical laboratory findings. Consider Gyn referral. 2.  Multiple calcified uterine fibroids.  Electronically Signed by: Evelina Bucy, MD on 01/02/2023 3:57 PM Narrative  COMPARISON:  01/04/2022 INDICATION: Adnexal mass   TECHNIQUE:  US PELVIS TRANSABDOMINAL  TRANSVAGINAL WITHOUT DOPPLER - Exam date/time:  01/01/2023 9:15 AM  FINDINGS: #  Uterus measures 9.7 x 7.1 x 6.9 cm. Multiple calcified uterine fibroids, the largest measures 6.9 x 5.3 cm. Endometrial stripe obscured by the fibroid disease. #  No pelvic fluid. Left ovary not visible. Right adnexal cystic mass noted which appears at least mildly complex. This measures 4 x 2.8 x 2.6 cm. Procedure Note  Evelina Bucy, MD - 01/02/2023 Formatting of this note might be different from the original. COMPARISON:  01/04/2022 INDICATION: Adnexal mass   TECHNIQUE:  US PELVIS TRANSABDOMINAL TRANSVAGINAL WITHOUT DOPPLER - Exam date/time:  01/01/2023 9:15 AM  FINDINGS: #  Uterus measures 9.7 x 7.1 x 6.9 cm. Multiple calcified uterine fibroids, the largest measures 6.9 x 5.3 cm. Endometrial stripe obscured by the fibroid disease. #  No pelvic fluid. Left ovary not visible. Right adnexal cystic mass noted which appears at least mildly complex. This measures 4 x 2.8 x 2.6 cm.   IMPRESSION: 1.  4 x 2.8 x 2.6 cm right adnexal cystic mass at least mildly complex. Correlate with clinical laboratory findings. Consider Gyn referral. 2.  Multiple calcified uterine fibroids.  Electronically Signed by: Evelina Bucy, MD on 01/02/2023 3:57 PM  GYNECOLOGIC HISTORY: No LMP recorded. Patient is postmenopausal. Contraception:  PMP Menopausal hormone therapy:  n/a Last mammogram:  pt reported one since then, 06/2021 BI-RADS CAT 1 neg Last pap smear:   01/09/22 neg:HR HPV neg  OB History     Gravida  2   Para  1   Term      Preterm      AB  1   Living  1      SAB  1   IAB      Ectopic      Multiple      Live Births                 Patient Active Problem List   Diagnosis Date Noted   Thrombocytopenia (HCC) 03/14/2018   Abscess of buttock, right 03/14/2018   Cellulitis and abscess of buttock 03/13/2018   AKI (acute kidney injury) (HCC) 03/13/2018   Diabetes mellitus type 2,  uncontrolled 03/13/2018   Anemia in other chronic diseases classified elsewhere 03/13/2018   Lumbosacral strain 06/19/2016   Trochanteric bursitis 06/19/2016    Past Medical History:  Diagnosis Date   Anxiety and depression    Arthritis    "back, arms" (03/13/2018)   Chronic lower back pain    GERD (gastroesophageal reflux disease)    Hyperlipemia    Hypertension    Iron deficiency anemia    "I'm getting iron infusions" (03/13/2018)   Myocardial infarction (HCC) ~ 1996   "little one"   Pneumonia ~ 1998 X 1   Type II diabetes mellitus (HCC)     Past Surgical History:  Procedure Laterality Date   BUNIONECTOMY WITH HAMMERTOE RECONSTRUCTION Left    CATARACT EXTRACTION, BILATERAL Bilateral    DILATION AND CURETTAGE OF UTERUS     EYE SURGERY Bilateral    "after cataract OR"   FRACTURE SURGERY     PERCUTANEOUS PINNING PHALANX FRACTURE OF HAND Right 2017   "took pins out after ~ 6 wks"   TONSILLECTOMY     TUMOR EXCISION  1970s X 2   "off my back"   TUMOR EXCISION Left    "jaw"    Current Outpatient Medications  Medication Sig Dispense Refill   ALPRAZolam (XANAX) 0.5 MG tablet Take by mouth.     aspirin 81 MG chewable tablet Chew by mouth daily.     Blood Glucose Monitoring Suppl (ONETOUCH VERIO) w/Device KIT by Does not apply route.     denosumab (PROLIA) 60 MG/ML SOSY injection Inject into the skin.     gabapentin (NEURONTIN) 300 MG capsule TAKE 1 CAPSULE(300 MG) BY MOUTH THREE TIMES DAILY     glipiZIDE (GLUCOTROL XL) 10 MG 24 hr tablet Take 10 mg by mouth daily.     Insulin Lispro Prot & Lispro (HUMALOG 75/25 MIX) (75-25) 100 UNIT/ML Kwikpen Inject 16 units ac breakfast 2 units ac lunch and 5-7 units ac supper  DX E11.8 and Z79.4     Insulin Pen Needle (BD PEN NEEDLE NANO 2ND GEN) 32G X 4 MM MISC Use with insulin twice daily and Trulicity once weekly  DX E11.8 and Z79.4     lisinopril-hydrochlorothiazide (PRINZIDE,ZESTORETIC) 20-12.5 MG tablet Take 1 tablet by mouth daily.  0    meloxicam (MOBIC) 7.5 MG tablet Take 7.5 mg by mouth daily.     metoprolol (TOPROL-XL) 100 MG 24 hr tablet Take 100 mg by mouth daily.       omeprazole (PRILOSEC) 20 MG capsule Take 20 mg by mouth daily.       ondansetron (ZOFRAN-ODT) 4 MG disintegrating tablet Take 4 mg by mouth every 8 (eight) hours as needed for nausea.  0   prochlorperazine (COMPAZINE) 10 MG tablet Take by mouth.  Propylene Glycol 0.6 % SOLN Apply 1 drop to eye as needed (dry eyes).     sertraline (ZOLOFT) 100 MG tablet Take 100 mg by mouth daily.       simvastatin (ZOCOR) 40 MG tablet Take 40 mg by mouth daily.      Vitamin D, Ergocalciferol, (DRISDOL) 1.25 MG (50000 UNIT) CAPS capsule Take 50,000 Units by mouth once a week.     ondansetron (ZOFRAN) 4 MG tablet Take 4 mg by mouth every 8 (eight) hours as needed. (Patient not taking: Reported on 01/07/2023)     TRULICITY 0.75 MG/0.5ML SOPN Inject 0.75 mg into the skin once a week. (Patient not taking: Reported on 01/07/2023)     No current facility-administered medications for this visit.     ALLERGIES: Acetaminophen and Percocet [oxycodone-acetaminophen]  Family History  Problem Relation Age of Onset   Stroke Mother    Diabetes Mother    Heart disease Mother    Stroke Father    Heart disease Brother    Stroke Brother    Heart disease Brother     Social History   Socioeconomic History   Marital status: Widowed    Spouse name: Not on file   Number of children: Not on file   Years of education: Not on file   Highest education level: Not on file  Occupational History   Not on file  Tobacco Use   Smoking status: Never   Smokeless tobacco: Never  Vaping Use   Vaping Use: Never used  Substance and Sexual Activity   Alcohol use: Yes    Comment: Rare   Drug use: Never   Sexual activity: Not Currently  Other Topics Concern   Not on file  Social History Narrative   Not on file   Social Determinants of Health   Financial Resource Strain: Not on file   Food Insecurity: Not on file  Transportation Needs: Not on file  Physical Activity: Not on file  Stress: Not on file  Social Connections: Not on file  Intimate Partner Violence: Not on file    Review of Systems  All other systems reviewed and are negative.   PHYSICAL EXAMINATION:    BP 116/68 (BP Location: Right Arm, Patient Position: Sitting, Cuff Size: Normal)   Pulse 62   Ht 5\' 3"  (1.6 m)   Wt 159 lb (72.1 kg)   SpO2 97%   BMI 28.17 kg/m     General appearance: alert, cooperative and appears stated age Neck: no adenopathy. Heart: regular rate and rhythm Abdomen: soft, non-tender, no masses,  no organomegaly No abnormal inguinal nodes palpated  Pelvic: External genitalia:  no lesions              Urethra:  normal appearing urethra with no masses, tenderness or lesions              Bartholins and Skenes: normal                 Vagina: normal appearing vagina with normal color and discharge, no lesions              Cervix: no lesions                Bimanual Exam:  Uterus:  enlarged with 7 - 8 cm firmness on right side.                Adnexa: no mass, fullness, tenderness.  Ovaries not felt separately from the uterus.  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 Slightly complex right adnexal mass by outside ultrasound.  Previous dx of simple right ovarian cyst on pelvic MRI. Fibroid uterus.  Gallstones.  Anemia.  Elevated creatinine.  DM.  PLAN  We reviewed her pelvic US, CT, and MRI reports.  Ovarian cysts discussed. Will get a CA125 today.  Will determine if we need to proceed with additional imaging, such as follow up US or pelvic MRI. We are likely working toward a GYN ONC consultation.    34 min  total time was spent for this patient encounter, including preparation, face-to-face counseling with the patient, coordination of care, and documentation of the encounter.'

## 2023-01-08 LAB — CA 125: CA 125: 18 U/mL (ref <35)

## 2023-01-10 ENCOUNTER — Telehealth: Payer: Self-pay

## 2023-01-10 DIAGNOSIS — N949 Unspecified condition associated with female genital organs and menstrual cycle: Secondary | ICD-10-CM

## 2023-01-10 DIAGNOSIS — D3911 Neoplasm of uncertain behavior of right ovary: Secondary | ICD-10-CM

## 2023-01-10 NOTE — Telephone Encounter (Signed)
Per result note from 01/07/2023: Please get pt set up for pelvic MRI w/ w/o contrast.   MRI order placed. [facility] to contact pt to schedule.

## 2023-01-14 NOTE — Telephone Encounter (Signed)
Per order notes: "01-14-2023 1ST ATTEMPT EPIC ORDER NO VM/DP"

## 2023-01-29 NOTE — Telephone Encounter (Signed)
MRI scheduled for 03/04/2023. Will double check with PA coordinator to make sure MRI was authorized before forwarding to provider.

## 2023-02-19 NOTE — Telephone Encounter (Signed)
Will route to provider for final review and close.  

## 2023-03-04 ENCOUNTER — Ambulatory Visit
Admission: RE | Admit: 2023-03-04 | Discharge: 2023-03-04 | Disposition: A | Discharge status: Home / Self Care | Payer: Medicare Other | Source: Ambulatory Visit | Attending: Obstetrics and Gynecology | Admitting: Obstetrics and Gynecology

## 2023-03-04 DIAGNOSIS — N949 Unspecified condition associated with female genital organs and menstrual cycle: Secondary | ICD-10-CM

## 2023-03-04 DIAGNOSIS — D3911 Neoplasm of uncertain behavior of right ovary: Secondary | ICD-10-CM

## 2023-03-08 ENCOUNTER — Telehealth: Payer: Self-pay | Admitting: Obstetrics and Gynecology

## 2023-03-08 NOTE — Telephone Encounter (Signed)
Please check on status of pelvic MRI.  Her MRI scheduled for 03/04/23 was cancelled and has not been rescheduled to date that I can see.

## 2023-03-10 NOTE — Telephone Encounter (Signed)
Spoke with Dr. Eppie Gibson.  Was advised ok to proceed with MRI of pelvis w/wo contrast.   Was advised the iron infusion would only be a concern if the patient was scheduled for MRI of abdomen, patient would need to wait 3 months after last infusion.  Advised per review of EPIC, no MRI of abdomen ordered. Was advised patient OK to reschedule, advise patient to ask for radiologist if this arises again.   Routing to Dr. Edward Jolly to review.  OK to proceed with re-scheduling MRI?   Cc: Andrew Au

## 2023-03-10 NOTE — Telephone Encounter (Signed)
Please proceed with pelvic MRI with/without contrast.  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

## 2023-03-10 NOTE — Telephone Encounter (Signed)
Spoke with patient. Patient states she went for imaging on 03/04/23, was advised could not proceed with MR Pelvis W/Wo contrast due to Hx of iron infusions, would no be able to read image. Patient states she was not provided any additional information.   Patient states her last iron infusion was 01/18/23. Recent Bone marrow test "was good". Reports kidney not function well".   Advised patient I will f/u with radiology and Dr. Edward Jolly and our office will f/u.   Patient request return call to her home number or her daughter, Leta Jungling.

## 2023-03-10 NOTE — Telephone Encounter (Signed)
Call placed to [facility] reading room at [phone], spoke with [facility]. Reviewed with Dr. Eppie Gibson, will return call back to me directly with recommendations once chart reviewed.

## 2023-03-11 NOTE — Telephone Encounter (Signed)
Spoke with patient.  Advised as seen below. Patient will contact [facility] to schedule MRI. Patient aware to contact the office if any additional assistance needed. Patient appreciative of call.

## 2023-03-12 NOTE — Telephone Encounter (Signed)
Per review of EPIC, MRI rescheduled for 05/12/23.   Routing to Dr. Marjorie Smolder.   Encounter closed.

## 2023-03-25 ENCOUNTER — Ambulatory Visit
Admission: EM | Admit: 2023-03-25 | Discharge: 2023-03-25 | Disposition: A | Discharge status: Home / Self Care | Payer: Medicare Other | Attending: Family Medicine | Admitting: Family Medicine

## 2023-03-25 ENCOUNTER — Other Ambulatory Visit: Payer: Self-pay

## 2023-03-25 DIAGNOSIS — T63441A Toxic effect of venom of bees, accidental (unintentional), initial encounter: Secondary | ICD-10-CM | POA: Diagnosis not present

## 2023-03-25 DIAGNOSIS — L03115 Cellulitis of right lower limb: Secondary | ICD-10-CM

## 2023-03-25 MED ORDER — FEXOFENADINE HCL 180 MG PO TABS: 180.0000 mg | ORAL_TABLET | Freq: Every day | ORAL | 0 refills | Status: DC

## 2023-03-25 MED ORDER — CEPHALEXIN 500 MG PO CAPS: 500.0000 mg | ORAL_CAPSULE | Freq: Two times a day (BID) | ORAL | 0 refills | Status: AC

## 2023-03-25 NOTE — Discharge Instructions (Addendum)
Advised patient to take medications as directed with food to completion.  Advised patient to take Allegra daily for the next 3 days.  Encouraged increase daily water intake to 64 ounces per day while taking these medications.  Advised if symptoms worsen and/or unresolved please follow-up with PCP or here for further evaluation.

## 2023-03-25 NOTE — ED Triage Notes (Signed)
Stung by wasp to right foot this morning

## 2023-03-25 NOTE — ED Provider Notes (Signed)
[facility]    CSN: [id number] Arrival date & time: 03/25/23  1021      History   Chief Complaint Chief Complaint  Patient presents with   Insect Bite    HPI Amber Gentry is a 82 y.o. female.   HPI Pleasant 82 year old female presents with wasp sting to right foot this morning. PMH significant for CAD, s/p MI, HTN, HLD  Past Medical History:  Diagnosis Date   Anxiety and depression    Arthritis    "back, arms" (03/13/2018)   Chronic lower back pain    GERD (gastroesophageal reflux disease)    Hyperlipemia    Hypertension    Iron deficiency anemia    "I'm getting iron infusions" (03/13/2018)   Myocardial infarction (HCC) ~ 1996   "little one"   Pneumonia ~ 1998 X 1   Type II diabetes mellitus (HCC)     Patient Active Problem List   Diagnosis Date Noted   Thrombocytopenia (HCC) 03/14/2018   Abscess of buttock, right 03/14/2018   Cellulitis and abscess of buttock 03/13/2018   AKI (acute kidney injury) (HCC) 03/13/2018   Diabetes mellitus type 2, uncontrolled 03/13/2018   Anemia in other chronic diseases classified elsewhere 03/13/2018   Lumbosacral strain 06/19/2016   Trochanteric bursitis 06/19/2016    Past Surgical History:  Procedure Laterality Date   BUNIONECTOMY WITH HAMMERTOE RECONSTRUCTION Left    CATARACT EXTRACTION, BILATERAL Bilateral    DILATION AND CURETTAGE OF UTERUS     EYE SURGERY Bilateral    "after cataract OR"   FRACTURE SURGERY     PERCUTANEOUS PINNING PHALANX FRACTURE OF HAND Right 2017   "took pins out after ~ 6 wks"   TONSILLECTOMY     TUMOR EXCISION  1970s X 2   "off my back"   TUMOR EXCISION Left    "jaw"    OB History     Gravida  2   Para  1   Term      Preterm      AB  1   Living  1      SAB  1   IAB      Ectopic      Multiple      Live Births               Home Medications    Prior to Admission medications   Medication Sig Start Date End Date Taking? Authorizing Provider   cephALEXin (KEFLEX) 500 MG capsule Take 1 capsule (500 mg total) by mouth 2 (two) times daily for 7 days. 03/25/23 04/01/23 Yes Trevor Iha, FNP  fexofenadine [facility] ALLERGY) 180 MG tablet Take 1 tablet (180 mg total) by mouth daily for 15 days. 03/25/23 04/09/23 Yes Trevor Iha, FNP  ALPRAZolam Prudy Feeler) 0.5 MG tablet Take by mouth. 10/02/20   [provider]  aspirin 81 MG chewable tablet Chew by mouth daily.    [provider]  Blood Glucose Monitoring Suppl (ONETOUCH VERIO) w/Device KIT by Does not apply route. 06/19/18   [provider]  denosumab (PROLIA) 60 MG/ML SOSY injection Inject into the skin. 02/04/20   [provider]  gabapentin (NEURONTIN) 300 MG capsule TAKE 1 CAPSULE(300 MG) BY MOUTH THREE TIMES DAILY 08/15/20   [provider]  glipiZIDE (GLUCOTROL XL) 10 MG 24 hr tablet Take 10 mg by mouth daily. 09/07/17   [provider]  Insulin Lispro Prot & Lispro (HUMALOG 75/25 MIX) (75-25) 100 UNIT/ML Kwikpen Inject 16 units ac  breakfast 2 units ac lunch and 5-7 units ac supper  DX E11.8 and Z79.4 07/03/20   [provider]  Insulin Pen Needle (BD PEN NEEDLE NANO 2ND GEN) 32G X 4 MM MISC Use with insulin twice daily and Trulicity once weekly  DX E11.8 and Z79.4 05/30/20   [provider]  lisinopril-hydrochlorothiazide (PRINZIDE,ZESTORETIC) 20-12.5 MG tablet Take 1 tablet by mouth daily. 01/11/18   [provider]  meloxicam (MOBIC) 7.5 MG tablet Take 7.5 mg by mouth daily. 08/16/20   [provider]  metoprolol (TOPROL-XL) 100 MG 24 hr tablet Take 100 mg by mouth daily.      [provider]  omeprazole (PRILOSEC) 20 MG capsule Take 20 mg by mouth daily.      [provider]  ondansetron (ZOFRAN) 4 MG tablet Take 4 mg by mouth every 8 (eight) hours as needed. Patient not taking: Reported on 01/07/2023 11/20/22   [provider]  ondansetron (ZOFRAN-ODT) 4 MG disintegrating tablet  Take 4 mg by mouth every 8 (eight) hours as needed for nausea. 03/11/18   [provider]  prochlorperazine (COMPAZINE) 10 MG tablet Take by mouth. 01/18/20   [provider]  Propylene Glycol 0.6 % SOLN Apply 1 drop to eye as needed (dry eyes).    [provider]  sertraline (ZOLOFT) 100 MG tablet Take 100 mg by mouth daily.      [provider]  simvastatin (ZOCOR) 40 MG tablet Take 40 mg by mouth daily.     [provider]  TRULICITY 0.75 MG/0.5ML SOPN Inject 0.75 mg into the skin once a week. Patient not taking: Reported on 01/07/2023 08/24/20   [provider]  Vitamin D, Ergocalciferol, (DRISDOL) 1.25 MG (50000 UNIT) CAPS capsule Take 50,000 Units by mouth once a week. 08/01/20   [provider]    Family History Family History  Problem Relation Age of Onset   Stroke Mother    Diabetes Mother    Heart disease Mother    Stroke Father    Heart disease Brother    Stroke Brother    Heart disease Brother     Social History Social History   Tobacco Use   Smoking status: Never   Smokeless tobacco: Never  Vaping Use   Vaping status: Never Used  Substance Use Topics   Alcohol use: Yes    Comment: Rare   Drug use: Never     Allergies   Acetaminophen and Percocet [oxycodone-acetaminophen]   Review of Systems Review of Systems   Physical Exam Triage Vital Signs ED Triage Vitals  Encounter Vitals Group     BP 03/25/23 1129 131/69     Systolic BP Percentile --      Diastolic BP Percentile --      Pulse Rate 03/25/23 1129 60     Resp 03/25/23 1129 16     Temp 03/25/23 1129 97.8 F (36.6 C)     Temp Source 03/25/23 1129 Oral     SpO2 03/25/23 1129 98 %     Weight --      Height --      Head Circumference --      Peak Flow --      Pain Score 03/25/23 1130 5     Pain Loc --      Pain Education --      Exclude from Growth Chart --    No data found.  Updated Vital Signs BP 131/69 (BP Location: Right  Arm)    Pulse 60   Temp 97.8 F (36.6 C) (Oral)   Resp 16   SpO2 98%    Physical Exam Vitals and nursing note reviewed.  Constitutional:      Appearance: Normal appearance. She is normal weight.  HENT:     Head: Normocephalic and atraumatic.     Mouth/Throat:     Mouth: Mucous membranes are moist.     Pharynx: Oropharynx is clear.  Eyes:     Extraocular Movements: Extraocular movements intact.     Conjunctiva/sclera: Conjunctivae normal.     Pupils: Pupils are equal, round, and reactive to light.  Cardiovascular:     Rate and Rhythm: Normal rate and regular rhythm.     Pulses: Normal pulses.     Heart sounds: Normal heart sounds.  Pulmonary:     Effort: Pulmonary effort is normal.     Breath sounds: Normal breath sounds. No wheezing, rhonchi or rales.  Musculoskeletal:        General: Normal range of motion.     Cervical back: Normal range of motion and neck supple.  Skin:    General: Skin is warm and dry.     Comments: Right foot (dorsum): Mild erythema with soft tissue swelling noted  Neurological:     General: No focal deficit present.     Mental Status: She is alert and oriented to person, place, and time. Mental status is at baseline.  Psychiatric:        Mood and Affect: Mood normal.        Behavior: Behavior normal.        Thought Content: Thought content normal.      UC Treatments / Results  Labs (all labs ordered are listed, but only abnormal results are displayed) Labs Reviewed - No data to display  EKG   Radiology No results found.  Procedures Procedures (including critical care time)  Medications Ordered in UC Medications - No data to display  Initial Impression / Assessment and Plan / UC Course  I have reviewed the triage vital signs and the nursing notes.  Pertinent labs & imaging results that were available during my care of the patient were reviewed by me and considered in my medical decision making (see chart for details).     MDM: 1.   Cellulitis of right foot-Rx'd Keflex 500 mg capsule twice daily x 7 days; 2.  Bee sting reaction, accidental or unintentional, initial encounter-Rx'd Allegra 180 mg fexofenadine daily x 3 days, then as needed for bee sting/allergic reaction. Advised patient to take medications as directed with food to completion.  Advised patient to take Allegra daily for the next 3 days.  Encouraged increase daily water intake to 64 ounces per day while taking these medications.  Advised if symptoms worsen and/or unresolved please follow-up with PCP or here for further evaluation.  Discharged home, hemodynamically stable. Final Clinical Impressions(s) / UC Diagnoses   Final diagnoses:  Bee sting reaction, accidental or unintentional, initial encounter  Cellulitis of right foot     Discharge Instructions      Advised patient to take medications as directed with food to completion.  Advised patient to take Allegra daily for the next 3 days.  Encouraged increase daily water intake to 64 ounces per day while taking these medications.  Advised if symptoms worsen and/or unresolved please follow-up with PCP or here for further evaluation.     ED Prescriptions     Medication Sig Dispense Auth.  Provider   cephALEXin (KEFLEX) 500 MG capsule Take 1 capsule (500 mg total) by mouth 2 (two) times daily for 7 days. 20 capsule Trevor Iha, FNP   fexofenadine [facility] ALLERGY) 180 MG tablet Take 1 tablet (180 mg total) by mouth daily for 15 days. 15 tablet Trevor Iha, FNP      PDMP not reviewed this encounter.   Trevor Iha, FNP 03/25/23 1206

## 2023-05-12 ENCOUNTER — Ambulatory Visit
Admission: RE | Admit: 2023-05-12 | Discharge: 2023-05-12 | Disposition: A | Discharge status: Home / Self Care | Payer: Medicare Other | Source: Ambulatory Visit | Attending: Obstetrics and Gynecology | Admitting: Obstetrics and Gynecology

## 2023-05-12 MED ORDER — GADOPICLENOL 0.5 MMOL/ML IV SOLN
7.0000 mL | Freq: Once | INTRAVENOUS | Status: AC | PRN
  Administered 2023-05-12: 7 mL via INTRAVENOUS

## 2023-06-04 ENCOUNTER — Other Ambulatory Visit: Payer: Self-pay | Admitting: *Deleted

## 2023-06-04 DIAGNOSIS — N950 Postmenopausal bleeding: Secondary | ICD-10-CM

## 2023-06-04 DIAGNOSIS — N83201 Unspecified ovarian cyst, right side: Secondary | ICD-10-CM

## 2023-06-04 DIAGNOSIS — D259 Leiomyoma of uterus, unspecified: Secondary | ICD-10-CM

## 2023-06-05 ENCOUNTER — Telehealth: Payer: Self-pay

## 2023-06-05 NOTE — Telephone Encounter (Signed)
Lvm for Ms.Opie to call office regarding a referral to see Dr.Tucker on 06/27/23.

## 2023-06-06 NOTE — Telephone Encounter (Signed)
Spoke with Amber Gentry, [t's daughter, regarding her referral to GYN oncology. She has an appointment scheduled with Dr. Pricilla Holm on 06/27/23 at 10:30. Patient agrees to date and time. She has been provided with office address and location. She is also aware of our mask and visitor policy. Patient verbalized understanding and will call with any questions.

## 2023-06-06 NOTE — Telephone Encounter (Signed)
LVM on both numbers in chart for patient to call office regarding a referral from Dr. Edward Jolly. There is an opening on 11/22 with Dr Pricilla Holm

## 2023-06-20 ENCOUNTER — Inpatient Hospital Stay
Admission: RE | Admit: 2023-06-20 | Discharge: 2023-06-20 | Disposition: A | Discharge status: Home / Self Care | Payer: Self-pay | Source: Ambulatory Visit | Attending: Gynecologic Oncology | Admitting: Gynecologic Oncology

## 2023-06-20 ENCOUNTER — Other Ambulatory Visit: Payer: Self-pay

## 2023-06-20 DIAGNOSIS — R109 Unspecified abdominal pain: Secondary | ICD-10-CM

## 2023-06-20 DIAGNOSIS — N9489 Other specified conditions associated with female genital organs and menstrual cycle: Secondary | ICD-10-CM

## 2023-06-23 ENCOUNTER — Telehealth: Payer: Self-pay | Admitting: *Deleted

## 2023-06-23 NOTE — Telephone Encounter (Signed)
Spoke with Ms. Adames's daughter Butler Denmark who called the office to reschedule her mother's new patient appt. With Dr. Pricilla Holm from Friday, November 22nd to Thursday, December 12 th at 0945. Mindi Junker agreed to new date and time for appointment, no further concerns or questions at this time.

## 2023-06-27 ENCOUNTER — Ambulatory Visit: Payer: Medicare Other | Admitting: Gynecologic Oncology

## 2023-07-11 ENCOUNTER — Encounter: Payer: Self-pay | Admitting: Gynecologic Oncology

## 2023-07-16 NOTE — Progress Notes (Unsigned)
GYNECOLOGIC ONCOLOGY NEW PATIENT CONSULTATION   Patient Name: Amber Gentry  Patient Age: 82 y.o. Date of Service: 07/17/23 Referring Provider: Dr. Conley Simmonds  Primary Care Provider: Verl Bangs, MD Consulting Provider: Eugene Garnet, MD   Assessment/Plan:  Postmenopausal patient with cystic adnexal mass and uterine fibroid.  I spent some time discussing recent imaging with the patient and her daughter.  We looked at most recent MRI images together.  The patient has a at least partially degenerated fibroid that is unchanged in size and appearance over the last year.  She remained relatively asymptomatic with regard to this lesion.  This has likely been present for years and I do not think that its degenerated appearance raises concern for significant pathology.  We also discussed the cystic lesion which has grown slowly over the last year and now measures just over 4 cm.  While there was some complexity on ultrasound over the summer, MRI of the pelvis at the end of July showed a cyst without malignant features.  Given its size, this is unlikely to cause symptomatology.  CA125 in June was normal.  We discussed the risk versus benefit of definitive surgery.  In the setting of her age and comorbidities, surgery is not without risk.  I do not feel that imaging characteristics of either her fibroid or the cyst warrant definitive surgery at this time.  We discussed that if she were to have new or worsening symptoms or we saw significant change in size or appearance of either lesion that this would lead to reconsideration of surgery.  The patient's preference as well as her daughters is to avoid surgery unless necessary.  I think it would be reasonable to repeat imaging in 1 year, either with a pelvic ultrasound or MRI to assure no significant change in size or appearance of her cystic adnexal mass.  We reviewed symptoms that should prompt a phone call either to my office or her OB/GYN over  the next year.  We also discussed her prior history of postmenopausal bleeding.  She has had 2 limited episodes, both that were followed by endometrial biopsies without hyperplasia or malignancy.  We discussed the importance of calling if she were to develop postmenopausal bleeding again.  A copy of this note was sent to the patient's referring provider.   58 minutes of total time was spent for this patient encounter, including preparation, face-to-face counseling with the patient and coordination of care, and documentation of the encounter.  Eugene Garnet, MD  Division of Gynecologic Oncology  Department of Obstetrics and Gynecology  [facility]  ___________________________________________  Chief Complaint: Chief Complaint  Patient presents with   Adnexal mass    History of Present Illness:  Amber Gentry is a 82 y.o. y.o. female who is seen in consultation at the request of Dr. Edward Jolly for an evaluation of an adnexal cyst.  Patient presents with her daughter today.  She notes overall doing well.  She has some chronic low back pain as well as intermittent low pelvic pain which she has had for years without recent changes.  She notes some intermittent right groin pain that has developed more recently.  She endorses normal bowel function and tries to eat regular bran, prunes, and apples.  She denies any urinary symptoms.  She endorses a good appetite and has had some recent weight gain after losing weight related to gallstones.  She has had to make significant changes to her diet to avoid any nausea or  diarrhea.  In terms of bleeding, she had an episode of vaginal bleeding last year.  She underwent endometrial biopsy at that time which was negative for any hyperplasia or malignancy.  She had another episode earlier this year which was again followed by a benign endometrial biopsy.  She denies any bleeding since.  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 She is currently on Aranesp, has previously received IV iron.  Recent history and imaging 01/2022: Seen for postmenopausal bleeding.  Endometrial biopsy at that time showed multiple superficial fragments of inactive appearing endometrium, negative for EIN or malignancy. 03/2022: Pelvic MRI - 6.6 cm anterior upper uterine segment mass most consistent with an intramural fibroid with at least partial degeneration. Benign appearing 3.4 cm right ovarian cyst, no follow-up imaging recommending. Colonic diverticulosis. Fat containing right inguinal hernia. 10/2022: CT A/P - 3.0 cm low-density lesion in right adnexa. 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 Cholelithiasis. Moderate right inguinal hernia. 12/2022: Pelvic TVUS - 4 cm right adnexal cyst, at least mildly complex. Multiple calcified uterine fibroids. 01/07/23: CA-125 18 01/15/23: EMB - multiple superficial strips of inactive appearing endometrium, no EIN or malignancy 03/04/23: MRI pelvis - 4.2 right ovarian cyst without malignant features (no solid components, mural nodularity or post contrast enhancement). 6.6 cm intramural fundal fibroid with moderate degeneration noted.  PAST MEDICAL HISTORY:  Past Medical History:  Diagnosis Date   Anxiety and depression    Arthritis    "back, arms" (03/13/2018)   Chronic lower back pain    GERD (gastroesophageal reflux disease)    Hyperlipemia    Hypertension    Iron deficiency anemia    "I'm getting iron infusions" (03/13/2018)   Myocardial infarction (HCC) ~ 1996   "little one"   Pneumonia ~ 1998 X 1   Type II diabetes mellitus (HCC)      PAST SURGICAL HISTORY:  Past Surgical History:  Procedure Laterality Date   BUNIONECTOMY WITH HAMMERTOE RECONSTRUCTION Left    CATARACT EXTRACTION, BILATERAL Bilateral    DILATION AND CURETTAGE OF UTERUS     shortly after menopause (lat4 1900s, maybe for spotting)   EYE SURGERY Bilateral    "after cataract  OR"   FRACTURE SURGERY     PERCUTANEOUS PINNING PHALANX FRACTURE OF HAND Right 2017   "took pins out after ~ 6 wks"   TONSILLECTOMY     TUMOR EXCISION  1970s X 2   "off my back"   TUMOR EXCISION Left    "jaw"    OB/GYN HISTORY:  OB History  Gravida Para Term Preterm AB Living  2 1   1 1   SAB IAB Ectopic Multiple Live Births  1        # Outcome Date GA Lbr Len/2nd Weight Sex Type Anes PTL Lv  2 SAB           1 Para             No LMP recorded. Patient is postmenopausal.  Age at menarche: 46  Age at menopause: 67 Hx of HRT: denies Hx of STDs: denies Last pap: 2023 - NIML pap, HR HPV negative History of abnormal pap smears: denies  SCREENING STUDIES:  Last mammogram: 2024 Last colonoscopy: 2024 Last bone mineral density: 2024  MEDICATIONS: Outpatient Encounter Medications as of 07/17/2023  Medication Sig   ALPRAZolam (XANAX) 0.5 MG tablet Take by mouth.   Blood Glucose Monitoring Suppl (ONETOUCH VERIO) w/Device KIT by Does not  apply route.   gabapentin (NEURONTIN) 300 MG capsule Pt reports taking 1 time a day   glipiZIDE (GLUCOTROL XL) 10 MG 24 hr tablet Take 10 mg by mouth daily.   Insulin Lispro Prot & Lispro (HUMALOG 75/25 MIX) (75-25) 100 UNIT/ML Kwikpen Inject 16 units ac breakfast 2 units ac lunch and 5-7 units ac supper  DX E11.8 and Z79.4   Insulin Pen Needle (BD PEN NEEDLE NANO 2ND GEN) 32G X 4 MM MISC Use with insulin twice daily and Trulicity once weekly  DX E11.8 and Z79.4   lisinopril-hydrochlorothiazide (PRINZIDE,ZESTORETIC) 20-12.5 MG tablet Take 1 tablet by mouth daily.   metoprolol (TOPROL-XL) 100 MG 24 hr tablet Take 100 mg by mouth daily.     omeprazole (PRILOSEC) 20 MG capsule Take 20 mg by mouth daily.     ondansetron (ZOFRAN-ODT) 4 MG disintegrating tablet Take 4 mg by mouth every 8 (eight) hours as needed for nausea.   prochlorperazine (COMPAZINE) 10 MG tablet Take by mouth.   sertraline (ZOLOFT) 100 MG tablet Take 100 mg by mouth daily.      simvastatin (ZOCOR) 40 MG tablet Take 40 mg by mouth daily.    Vitamin D, Ergocalciferol, (DRISDOL) 1.25 MG (50000 UNIT) CAPS capsule Take 50,000 Units by mouth once a week.   fexofenadine (ALLEGRA ALLERGY) 180 MG tablet Take 1 tablet (180 mg total) by mouth daily for 15 days.   [DISCONTINUED] aspirin 81 MG chewable tablet Chew by mouth daily.   [DISCONTINUED] denosumab (PROLIA) 60 MG/ML SOSY injection Inject into the skin.   [DISCONTINUED] meloxicam (MOBIC) 7.5 MG tablet Take 7.5 mg by mouth daily.   [DISCONTINUED] ondansetron (ZOFRAN) 4 MG tablet Take 4 mg by mouth every 8 (eight) hours as needed. (Patient not taking: Reported on 01/07/2023)   [DISCONTINUED] Propylene Glycol 0.6 % SOLN Apply 1 drop to eye as needed (dry eyes).   [DISCONTINUED] TRULICITY 0.75 MG/0.5ML SOPN Inject 0.75 mg into the skin once a week. (Patient not taking: Reported on 01/07/2023)   No facility-administered encounter medications on file as of 07/17/2023.    ALLERGIES:  Allergies  Allergen Reactions   Acetaminophen Other (See Comments)    ELEVATED LIVER ENZYME ELEVATED LIVER ENZYME    Percocet [Oxycodone-Acetaminophen] Nausea Only     FAMILY HISTORY:  Family History  Problem Relation Age of Onset   Stroke Mother    Diabetes Mother    Heart disease Mother    Stroke Father    Heart disease Brother    Stroke Brother    Heart disease Brother      SOCIAL HISTORY:  Social Connections: Moderately Integrated (07/07/2023)   Received from [facility]   Social Network    How would you rate your social network (family, work, friends)?: Adequate participation with social networks    REVIEW OF SYSTEMS:  + Fatigue, joint pain, back pain, anxiety Denies appetite changes, fevers, chills, unexplained weight changes. Denies hearing loss, neck lumps or masses, mouth sores, ringing in ears or voice changes. Denies cough or wheezing.  Denies shortness of breath. Denies chest pain or palpitations. Denies leg  swelling. Denies abdominal distention, pain, blood in stools, constipation, diarrhea, nausea, vomiting, or early satiety. Denies pain with intercourse, dysuria, frequency, hematuria or incontinence. Denies hot flashes, pelvic pain, vaginal bleeding or vaginal discharge.   Denies muscle pain/cramps. Denies itching, rash, or wounds. Denies dizziness, headaches, numbness or seizures. Denies swollen lymph nodes or glands, denies easy bruising or bleeding. Denies depression, confusion, or decreased concentration.  Physical Exam:  Vital Signs for this encounter:  Blood pressure (!) 146/78, pulse 72, temperature 98.4 F (36.9 C), temperature source Oral, resp. rate 20, weight 160 lb 3.2 oz (72.7 kg), SpO2 95%. Body mass index is 28.38 kg/m. General: Alert, oriented, no acute distress.  HEENT: Normocephalic, atraumatic. Sclera anicteric.  Chest: Clear to auscultation bilaterally. No wheezes, rhonchi, or rales. Cardiovascular: Regular rate and rhythm, no murmurs, rubs, or gallops.  Abdomen: Normoactive bowel sounds. Soft, nondistended, nontender to palpation. No masses or hepatosplenomegaly appreciated. No palpable fluid wave.  Extremities: Grossly normal range of motion. Warm, well perfused. No edema bilaterally.  Skin: No rashes or lesions.  Lymphatics: No cervical, supraclavicular, or inguinal adenopathy.  GU:  Normal external female genitalia. No lesions. No discharge or bleeding.             Bladder/urethra:  No lesions or masses, well supported bladder             Vagina: Moderately atrophic, no lesions noted.             Cervix: Normal appearing, no lesions. Atrophic.             Uterus: Mildly enlarged, bulbous, mobile, mild tenderness with deep palpation using the abdominal hand. No parametrial involvement or nodularity.             Adnexa: No distinct masses appreciated.  Rectal: Deferred.  LABORATORY AND RADIOLOGIC DATA:  Outside medical records were reviewed to synthesize the  above history, along with the history and physical obtained during the visit.   Lab Results  Component Value Date   WBC 6.0 03/15/2018   HGB 9.4 (L) 03/15/2018   HCT 30.1 (L) 03/15/2018   PLT 135 (L) 03/15/2018   GLUCOSE 152 (H) 03/15/2018   ALT 32 03/13/2018   AST 34 03/13/2018   NA 136 03/15/2018   K 3.9 03/15/2018   CL 101 03/15/2018   CREATININE 1.55 (H) 03/15/2018   BUN 21 03/15/2018   CO2 26 03/15/2018

## 2023-07-17 ENCOUNTER — Encounter: Payer: Self-pay | Admitting: Gynecologic Oncology

## 2023-07-17 ENCOUNTER — Inpatient Hospital Stay: Payer: Medicare Other | Attending: Gynecologic Oncology | Admitting: Gynecologic Oncology

## 2023-07-17 VITALS — BP 146/78 | HR 72 | Temp 98.4°F | Resp 20 | Wt 160.2 lb

## 2023-07-17 DIAGNOSIS — D398 Neoplasm of uncertain behavior of other specified female genital organs: Secondary | ICD-10-CM | POA: Insufficient documentation

## 2023-07-17 DIAGNOSIS — F419 Anxiety disorder, unspecified: Secondary | ICD-10-CM | POA: Insufficient documentation

## 2023-07-17 DIAGNOSIS — I10 Essential (primary) hypertension: Secondary | ICD-10-CM | POA: Insufficient documentation

## 2023-07-17 DIAGNOSIS — F32A Depression, unspecified: Secondary | ICD-10-CM | POA: Diagnosis not present

## 2023-07-17 DIAGNOSIS — E785 Hyperlipidemia, unspecified: Secondary | ICD-10-CM | POA: Insufficient documentation

## 2023-07-17 DIAGNOSIS — E119 Type 2 diabetes mellitus without complications: Secondary | ICD-10-CM | POA: Diagnosis not present

## 2023-07-17 DIAGNOSIS — K219 Gastro-esophageal reflux disease without esophagitis: Secondary | ICD-10-CM | POA: Insufficient documentation

## 2023-07-17 DIAGNOSIS — D259 Leiomyoma of uterus, unspecified: Secondary | ICD-10-CM | POA: Diagnosis not present

## 2023-07-17 DIAGNOSIS — D509 Iron deficiency anemia, unspecified: Secondary | ICD-10-CM | POA: Insufficient documentation

## 2023-07-17 DIAGNOSIS — N9489 Other specified conditions associated with female genital organs and menstrual cycle: Secondary | ICD-10-CM | POA: Insufficient documentation

## 2023-07-17 DIAGNOSIS — Z79899 Other long term (current) drug therapy: Secondary | ICD-10-CM | POA: Insufficient documentation

## 2023-07-17 DIAGNOSIS — R102 Pelvic and perineal pain: Secondary | ICD-10-CM | POA: Insufficient documentation

## 2023-07-17 DIAGNOSIS — I252 Old myocardial infarction: Secondary | ICD-10-CM | POA: Insufficient documentation

## 2023-07-17 NOTE — Patient Instructions (Signed)
It was very nice to meet you.  We discussed your recent imaging which shows a stable fibroid, or benign mass on the uterus that is stable in size from prior imaging in 2023.  This fibroid is at least partially degenerated, which means that it has lost some of its blood supply and has started to die or liquefy.  The other finding on your imaging is a simple appearing cyst on your right ovary.  This was seen on prior imaging last year and has grown minimally.  It now measures approximately 4 cm but does not have any features that raise concern for a precancer or cancer of the ovary.  I think it would be very reasonable to repeat imaging in a year with either an ultrasound or MRI.  If you were to develop new or worsening symptoms before then, please reach out to your OB/GYN or to my office so that we can get imaging scheduled sooner.

## 2024-01-22 NOTE — Progress Notes (Signed)
 Sent message, via epic in basket, requesting orders in epic from Careers adviser.

## 2024-01-27 NOTE — Progress Notes (Signed)
 Second request for pre op orders: Left voicemail on nurse triage line.

## 2024-01-27 NOTE — Patient Instructions (Signed)
 SURGICAL WAITING ROOM VISITATION  Patients having surgery or a procedure may have no more than 2 support people in the waiting area - these visitors may rotate.    Children under the age of 67 must have an adult with them who is not the patient.  Visitors with respiratory illnesses are discouraged from visiting and should remain at home.  If the patient needs to stay at the hospital during part of their recovery, the visitor guidelines for inpatient rooms apply. Pre-op nurse will coordinate an appropriate time for 1 support person to accompany patient in pre-op.  This support person may not rotate.    Please refer to the [facility] website for the visitor guidelines for Inpatients (after your surgery is over and you are in a regular room).       Your procedure is scheduled on: 02/02/24   Report to [facility] Main Entrance    Report to admitting at 7:15 AM   Call this number if you have problems the morning of surgery [phone]   Do not eat food or drink liquids :After Midnight.but may have sips of water with meds.      Oral Hygiene is also important to reduce your risk of infection.                                    Remember - BRUSH YOUR TEETH THE MORNING OF SURGERY WITH YOUR REGULAR TOOTHPASTE   Stop all vitamins and herbal supplements 7 days before surgery.   Take these medicines the morning of surgery with A SIP OF WATER: xanax , gabapentin, Metoprolol , omeprazole, zoloft , simvastatin               Do not take Zestoretic the morning of surgery  DO NOT TAKE ANY ORAL DIABETIC MEDICATIONS DAY OF YOUR SURGERY Hold Glipizide the morning of surgery.             You may not have any metal on your body including hair pins, jewelry, and body piercing             Do not wear make-up, lotions, powders, perfumes/cologne, or deodorant  Do not wear nail polish including gel and S&S, artificial/acrylic nails, or any other type of covering on natural nails including finger  and toenails. If you have artificial nails, gel coating, etc. that needs to be removed by a nail salon please have this removed prior to surgery or surgery may need to be canceled/ delayed if the surgeon/ anesthesia feels like they are unable to be safely monitored.   Do not shave  48 hours prior to surgery.    Do not bring valuables to the hospital.  IS NOT             RESPONSIBLE   FOR VALUABLES.   Contacts, glasses, dentures or bridgework may not be worn into surgery.  DO NOT BRING YOUR HOME MEDICATIONS TO THE HOSPITAL. PHARMACY WILL DISPENSE MEDICATIONS LISTED ON YOUR MEDICATION LIST TO YOU DURING YOUR ADMISSION IN THE HOSPITAL!    Patients discharged on the day of surgery will not be allowed to drive home.  Someone NEEDS to stay with you for the first 24 hours after anesthesia.   Special Instructions: Bring a copy of your healthcare power of attorney and living will documents the day of surgery if you haven't scanned them before.  Please read over the following fact sheets you were given: IF YOU HAVE QUESTIONS ABOUT YOUR PRE-OP INSTRUCTIONS PLEASE CALL [phone] [place]   If you received a COVID test during your pre-op visit  it is requested that you wear a mask when out in public, stay away from anyone that may not be feeling well and notify your surgeon if you develop symptoms. If you test positive for Covid or have been in contact with anyone that has tested positive in the last 10 days please notify you surgeon.    [facility] - Preparing for Surgery Before surgery, you can play an important role.  Because skin is not sterile, your skin needs to be as free of germs as possible.  You can reduce the number of germs on your skin by washing with CHG (chlorahexidine gluconate) soap before surgery.  CHG is an antiseptic cleaner which kills germs and bonds with the skin to continue killing germs even after washing. Please DO NOT use if you have an allergy to CHG or  antibacterial soaps.  If your skin becomes reddened/irritated stop using the CHG and inform your nurse when you arrive at Short Stay. Do not shave (including legs and underarms) for at least 48 hours prior to the first CHG shower.  You may shave your face/neck.  Please follow these instructions carefully:  1.  Shower with CHG Soap the night before surgery and the  morning of surgery.  2.  If you choose to wash your hair, wash your hair first as usual with your normal  shampoo.  3.  After you shampoo, rinse your hair and body thoroughly to remove the shampoo.                             4.  Use CHG as you would any other liquid soap.  You can apply chg directly to the skin and wash.  Gently with a scrungie or clean washcloth.  5.  Apply the CHG Soap to your body ONLY FROM THE NECK DOWN.   Do   not use on face/ open                           Wound or open sores. Avoid contact with eyes, ears mouth and   genitals (private parts).                       Wash face,  Genitals (private parts) with your normal soap.             6.  Wash thoroughly, paying special attention to the area where your    surgery  will be performed.  7.  Thoroughly rinse your body with warm water from the neck down.  8.  DO NOT shower/wash with your normal soap after using and rinsing off the CHG Soap.                9.  Pat yourself dry with a clean towel.            10.  Wear clean pajamas.            11.  Place clean sheets on your bed the night of your first shower and do not  sleep with pets. Day of Surgery : Do not apply any lotions/deodorants the morning of surgery.  Please wear clean clothes to the hospital/surgery center.  FAILURE TO FOLLOW THESE INSTRUCTIONS MAY RESULT IN THE CANCELLATION OF YOUR SURGERY How to Manage Your Diabetes Before and After Surgery  Why is it important to control my blood sugar before and after surgery? Improving blood sugar levels before and after surgery helps healing and can limit  problems. A way of improving blood sugar control is eating a healthy diet by:  Eating less sugar and carbohydrates  Increasing activity/exercise  Talking with your doctor about reaching your blood sugar goals High blood sugars (greater than 180 mg/dL) can raise your risk of infections and slow your recovery, so you will need to focus on controlling your diabetes during the weeks before surgery. Make sure that the doctor who takes care of your diabetes knows about your planned surgery including the date and location.  How do I manage my blood sugar before surgery? Check your blood sugar at least 4 times a day, starting 2 days before surgery, to make sure that the level is not too high or low. Check your blood sugar the morning of your surgery when you wake up and every 2 hours until you get to the Short Stay unit. If your blood sugar is less than 70 mg/dL, you will need to treat for low blood sugar: Do not take insulin . Treat a low blood sugar (less than 70 mg/dL) with  cup of clear juice (cranberry or apple), 4 glucose tablets, OR glucose gel. Recheck blood sugar in 15 minutes after treatment (to make sure it is greater than 70 mg/dL). If your blood sugar is not greater than 70 mg/dL on recheck, call [phone] for further instructions. Report your blood sugar to the short stay nurse when you get to Short Stay.  If you are admitted to the hospital after surgery: Your blood sugar will be checked by the staff and you will probably be given insulin  after surgery (instead of oral diabetes medicines) to make sure you have good blood sugar levels. The goal for blood sugar control after surgery is 80-180 mg/dL.   WHAT DO I DO ABOUT MY DIABETES MEDICATION?  Do not take oral diabetes medicines (pills) the morning of surgery.Hold Glipizide the morning of surgery.  THE MORNING OF SURGERY,  DO NOT TAKE THE MORNING INSULIN .  Patient Signature:  Date:   Nurse  Signature:  Date:  __________________________________________________________

## 2024-01-27 NOTE — Progress Notes (Signed)
 COVID Vaccine received:  []  No [x]  Yes Date of any COVID positive Test in last 90 days: no PCP - Elvera Cella MD Cardiologist -   Chest x-ray -  EKG -   Stress Test -  ECHO -  Cardiac Cath -   Bowel Prep - [x]  No  []   Yes ______  Pacemaker / ICD device [x]  No []  Yes   Spinal Cord Stimulator:[x]  No []  Yes       History of Sleep Apnea? [x]  No []  Yes   CPAP used?- [x]  No []  Yes    Does the patient monitor blood sugar?          []  No [x]  Yes  []  N/A  Patient has: []  NO Hx DM   []  Pre-DM                 []  DM1  [x]   DM2 Does patient have a [employer] or Dexacom? [x]  No []  Yes   Fasting Blood Sugar Ranges- 100's Checks Blood Sugar ___1__ times a day  GLP1 agonist / usual dose - no GLP1 instructions:  SGLT-2 inhibitors / usual dose - no SGLT-2 instructions:   Blood Thinner / Instructions:no Aspirin  Instructions:no  Comments:   Activity level: Patient is able  to climb a flight of stairs without difficulty; [x]  No CP  [x]  No SOB,    Patient can  perform ADLs without assistance.   Anesthesia review: DM, Anemia, HTN, CKD, MI 1990's  Patient denies shortness of breath, fever, cough and chest pain at PAT appointment.  Patient verbalized understanding and agreement to the Pre-Surgical Instructions that were given to them at this PAT appointment. Patient was also educated of the need to review these PAT instructions again prior to his/her surgery.I reviewed the appropriate phone numbers to call if they have any and questions or concerns.

## 2024-01-29 ENCOUNTER — Other Ambulatory Visit: Payer: Self-pay

## 2024-01-29 ENCOUNTER — Encounter (HOSPITAL_COMMUNITY)
Admission: RE | Admit: 2024-01-29 | Discharge: 2024-01-29 | Disposition: A | Discharge status: Home / Self Care | Source: Ambulatory Visit | Attending: Surgery | Admitting: Surgery

## 2024-01-29 ENCOUNTER — Encounter (HOSPITAL_COMMUNITY): Payer: Self-pay

## 2024-01-29 VITALS — BP 163/60 | HR 59 | Temp 98.2°F | Resp 18 | Ht 65.0 in | Wt 152.0 lb

## 2024-01-29 DIAGNOSIS — Z01818 Encounter for other preprocedural examination: Secondary | ICD-10-CM | POA: Diagnosis present

## 2024-01-29 DIAGNOSIS — Z794 Long term (current) use of insulin: Secondary | ICD-10-CM | POA: Insufficient documentation

## 2024-01-29 DIAGNOSIS — I129 Hypertensive chronic kidney disease with stage 1 through stage 4 chronic kidney disease, or unspecified chronic kidney disease: Secondary | ICD-10-CM | POA: Insufficient documentation

## 2024-01-29 DIAGNOSIS — N183 Chronic kidney disease, stage 3 unspecified: Secondary | ICD-10-CM | POA: Insufficient documentation

## 2024-01-29 DIAGNOSIS — Z7984 Long term (current) use of oral hypoglycemic drugs: Secondary | ICD-10-CM | POA: Diagnosis not present

## 2024-01-29 DIAGNOSIS — I10 Essential (primary) hypertension: Secondary | ICD-10-CM

## 2024-01-29 DIAGNOSIS — K8020 Calculus of gallbladder without cholecystitis without obstruction: Secondary | ICD-10-CM | POA: Diagnosis not present

## 2024-01-29 DIAGNOSIS — I252 Old myocardial infarction: Secondary | ICD-10-CM | POA: Diagnosis not present

## 2024-01-29 DIAGNOSIS — E1122 Type 2 diabetes mellitus with diabetic chronic kidney disease: Secondary | ICD-10-CM | POA: Diagnosis not present

## 2024-01-29 HISTORY — DX: Chronic kidney disease, unspecified: N18.9

## 2024-01-29 LAB — GLUCOSE, CAPILLARY: Glucose-Capillary: 143 mg/dL — ABNORMAL HIGH (ref 70–99)

## 2024-01-30 NOTE — Progress Notes (Signed)
 Anesthesia Chart Review   Case: [id number] Date/Time: 02/02/24 0915   Procedures:      LAPAROSCOPIC CHOLECYSTECTOMY - ICG CHOLANGIOGRAPHY     BIOPSY, LIVER   Anesthesia type: General   Diagnosis: Symptomatic cholelithiasis [K80.20]   Pre-op diagnosis: SYMPTOMATIC CHOLELITHIASIS, POSSIBLE CIRRHOSIS   Location: [facility] ROOM 05 / [facility]   Surgeons: Teresa Lonni HERO, MD       DISCUSSION:83 y.o. never smoker with h/o HTN, CKD Stage III, DM II, thrombocytopenia followed by hematology, symptomatic cholelithiasis, possible cirrhosis scheduled for above procedure 02/02/2024 with Dr. Lonni Teresa.   MI listed in pt history, comments state little one in 1996.  Per PCP notes there is no h/o CAD or MI. There is a note from 2023 that states that mild heart attack in the 90's was actually indigestion. Pt reports she can climb a flight of stairs without chest pain or shortness of breath.  VS: BP (!) 163/60   Pulse (!) 59   Temp 36.8 C (Oral)   Resp 18   Ht 5' 5 (1.651 m)   Wt 68.9 kg   SpO2 100%   BMI 25.29 kg/m   PROVIDERS: Radiontchenko, Alexei, MD is PCP    LABS: Labs reviewed: Acceptable for surgery. (all labs ordered are listed, but only abnormal results are displayed)  Labs Reviewed  GLUCOSE, CAPILLARY - Abnormal; Notable for the following components:      Result Value   Glucose-Capillary 143 (*)    All other components within normal limits     IMAGES:   EKG:   CV:  Past Medical History:  Diagnosis Date   Anxiety and depression    Arthritis    back, arms (03/13/2018)   Chronic kidney disease    Chronic lower back pain    Degeneration of uterine fibroid 2024   Needs yearly US  or MRI   GERD (gastroesophageal reflux disease)    Hyperlipemia    Hypertension    Iron deficiency anemia    I'm getting iron infusions (03/13/2018)   Myocardial infarction (HCC) ~ 1996   little one   Pneumonia ~ 1998 X 1   Right ovarian cyst    Needs yearly US  or MRI   Type II  diabetes mellitus (HCC)     Past Surgical History:  Procedure Laterality Date   BUNIONECTOMY WITH HAMMERTOE RECONSTRUCTION Left    CATARACT EXTRACTION, BILATERAL Bilateral    DILATION AND CURETTAGE OF UTERUS     shortly after menopause (lat4 1900s, maybe for spotting)   EYE SURGERY Bilateral    after cataract OR   FRACTURE SURGERY     PERCUTANEOUS PINNING PHALANX FRACTURE OF HAND Right 2017   took pins out after ~ 6 wks   TONSILLECTOMY     TUMOR EXCISION  1970s X 2   off my back   TUMOR EXCISION Left    jaw    MEDICATIONS:  ALPRAZolam  (XANAX ) 0.5 MG tablet   Blood Glucose Monitoring Suppl (ONETOUCH VERIO) w/Device KIT   gabapentin (NEURONTIN) 300 MG capsule   glipiZIDE (GLUCOTROL XL) 10 MG 24 hr tablet   Insulin  Pen Needle (BD PEN NEEDLE NANO 2ND GEN) 32G X 4 MM MISC   lisinopril-hydrochlorothiazide (PRINZIDE,ZESTORETIC) 20-12.5 MG tablet   metoprolol  succinate (TOPROL -XL) 50 MG 24 hr tablet   NOVOLOG  MIX 70/30 FLEXPEN (70-30) 100 UNIT/ML FlexPen   omeprazole (PRILOSEC) 20 MG capsule   sertraline  (ZOLOFT ) 100 MG tablet   simvastatin  (ZOCOR ) 40 MG tablet   No current  facility-administered medications for this encounter.      Harlene Hoots Ward, PA-C [facility] [phone]

## 2024-02-02 ENCOUNTER — Encounter (HOSPITAL_COMMUNITY)
Admission: RE | Disposition: A | Discharge status: Home / Self Care | Payer: Self-pay | Source: Home / Self Care | Attending: Surgery

## 2024-02-02 ENCOUNTER — Ambulatory Visit (HOSPITAL_BASED_OUTPATIENT_CLINIC_OR_DEPARTMENT_OTHER): Payer: Self-pay | Admitting: Anesthesiology

## 2024-02-02 ENCOUNTER — Other Ambulatory Visit: Payer: Self-pay

## 2024-02-02 ENCOUNTER — Encounter (HOSPITAL_COMMUNITY): Payer: Self-pay | Admitting: Anesthesiology

## 2024-02-02 ENCOUNTER — Observation Stay (HOSPITAL_COMMUNITY)
Admission: RE | Admit: 2024-02-02 | Discharge: 2024-02-03 | Disposition: A | Discharge status: Home / Self Care | Attending: Surgery | Admitting: Surgery

## 2024-02-02 ENCOUNTER — Encounter (HOSPITAL_COMMUNITY): Payer: Self-pay | Admitting: Surgery

## 2024-02-02 DIAGNOSIS — I10 Essential (primary) hypertension: Secondary | ICD-10-CM

## 2024-02-02 DIAGNOSIS — K8020 Calculus of gallbladder without cholecystitis without obstruction: Secondary | ICD-10-CM | POA: Diagnosis not present

## 2024-02-02 DIAGNOSIS — K7469 Other cirrhosis of liver: Secondary | ICD-10-CM | POA: Diagnosis not present

## 2024-02-02 DIAGNOSIS — E119 Type 2 diabetes mellitus without complications: Secondary | ICD-10-CM | POA: Insufficient documentation

## 2024-02-02 DIAGNOSIS — Z9049 Acquired absence of other specified parts of digestive tract: Principal | ICD-10-CM

## 2024-02-02 DIAGNOSIS — Z79899 Other long term (current) drug therapy: Secondary | ICD-10-CM | POA: Diagnosis not present

## 2024-02-02 DIAGNOSIS — K8010 Calculus of gallbladder with chronic cholecystitis without obstruction: Principal | ICD-10-CM | POA: Insufficient documentation

## 2024-02-02 DIAGNOSIS — I252 Old myocardial infarction: Secondary | ICD-10-CM | POA: Diagnosis not present

## 2024-02-02 HISTORY — PX: CHOLECYSTECTOMY: SHX55

## 2024-02-02 HISTORY — PX: LIVER BIOPSY: SHX301

## 2024-02-02 LAB — BASIC METABOLIC PANEL WITH GFR
Anion gap: 8 (ref 5–15)
BUN: 17 mg/dL (ref 8–23)
CO2: 27 mmol/L (ref 22–32)
Calcium: 8.7 mg/dL — ABNORMAL LOW (ref 8.9–10.3)
Chloride: 104 mmol/L (ref 98–111)
Creatinine, Ser: 0.96 mg/dL (ref 0.44–1.00)
GFR, Estimated: 59 mL/min — ABNORMAL LOW (ref >60)
Glucose, Bld: 141 mg/dL — ABNORMAL HIGH (ref 70–99)
Potassium: 4.3 mmol/L (ref 3.5–5.1)
Sodium: 139 mmol/L (ref 135–145)

## 2024-02-02 LAB — TYPE AND SCREEN
ABO/RH(D): O POS
Antibody Screen: NEGATIVE

## 2024-02-02 LAB — GLUCOSE, CAPILLARY
Glucose-Capillary: 136 mg/dL — ABNORMAL HIGH (ref 70–99)
Glucose-Capillary: 154 mg/dL — ABNORMAL HIGH (ref 70–99)
Glucose-Capillary: 252 mg/dL — ABNORMAL HIGH (ref 70–99)
Glucose-Capillary: 198 mg/dL — ABNORMAL HIGH (ref 70–99)

## 2024-02-02 LAB — ABO/RH: ABO/RH(D): O POS

## 2024-02-02 SURGERY — LAPAROSCOPIC CHOLECYSTECTOMY
Anesthesia: General

## 2024-02-02 MED ORDER — LIDOCAINE HCL (PF) 2 % IJ SOLN
INTRAMUSCULAR | Status: AC
  Filled 2024-02-02: qty 5

## 2024-02-02 MED ORDER — ONDANSETRON HCL 4 MG/2ML IJ SOLN
INTRAMUSCULAR | Status: AC
  Filled 2024-02-02: qty 2

## 2024-02-02 MED ORDER — LIDOCAINE HCL (CARDIAC) PF 100 MG/5ML IV SOSY
PREFILLED_SYRINGE | INTRAVENOUS | Status: DC | PRN
  Administered 2024-02-02: 75 mg via INTRAVENOUS

## 2024-02-02 MED ORDER — CHLORHEXIDINE GLUCONATE CLOTH 2 % EX PADS: 6.0000 | MEDICATED_PAD | Freq: Once | CUTANEOUS | Status: DC

## 2024-02-02 MED ORDER — TRAMADOL HCL 50 MG PO TABS: 50.0000 mg | ORAL_TABLET | Freq: Four times a day (QID) | ORAL | 0 refills | Status: DC | PRN

## 2024-02-02 MED ORDER — FENTANYL CITRATE PF 50 MCG/ML IJ SOSY
PREFILLED_SYRINGE | INTRAMUSCULAR | Status: AC
Start: 2024-02-02 — End: 2024-02-02
  Filled 2024-02-02: qty 1

## 2024-02-02 MED ORDER — SUGAMMADEX SODIUM 200 MG/2ML IV SOLN
INTRAVENOUS | Status: DC | PRN
  Administered 2024-02-02: 140 mg via INTRAVENOUS

## 2024-02-02 MED ORDER — PROPOFOL 10 MG/ML IV BOLUS
INTRAVENOUS | Status: AC
  Filled 2024-02-02: qty 20

## 2024-02-02 MED ORDER — CHLORHEXIDINE GLUCONATE 0.12 % MT SOLN
15.0000 mL | Freq: Once | OROMUCOSAL | Status: AC
  Administered 2024-02-02: 15 mL via OROMUCOSAL

## 2024-02-02 MED ORDER — TRAMADOL HCL 50 MG PO TABS
50.0000 mg | ORAL_TABLET | Freq: Four times a day (QID) | ORAL | Status: DC | PRN
  Administered 2024-02-02 – 2024-02-03 (×3): 50 mg via ORAL
  Filled 2024-02-02 (×3): qty 1

## 2024-02-02 MED ORDER — SERTRALINE HCL 100 MG PO TABS
100.0000 mg | ORAL_TABLET | Freq: Every day | ORAL | Status: DC
  Filled 2024-02-02: qty 1

## 2024-02-02 MED ORDER — DEXAMETHASONE SODIUM PHOSPHATE 10 MG/ML IJ SOLN
INTRAMUSCULAR | Status: AC
  Filled 2024-02-02: qty 1

## 2024-02-02 MED ORDER — DOCUSATE SODIUM 100 MG PO CAPS
200.0000 mg | ORAL_CAPSULE | Freq: Two times a day (BID) | ORAL | Status: DC
  Administered 2024-02-02 – 2024-02-03 (×2): 200 mg via ORAL
  Filled 2024-02-02 (×2): qty 2

## 2024-02-02 MED ORDER — LACTATED RINGERS IV SOLN: INTRAVENOUS | Status: DC

## 2024-02-02 MED ORDER — KETOROLAC TROMETHAMINE 15 MG/ML IJ SOLN
15.0000 mg | Freq: Once | INTRAMUSCULAR | Status: AC
  Administered 2024-02-02: 15 mg via INTRAVENOUS

## 2024-02-02 MED ORDER — CARMEX CLASSIC LIP BALM EX OINT
TOPICAL_OINTMENT | CUTANEOUS | Status: DC | PRN
  Filled 2024-02-02: qty 10

## 2024-02-02 MED ORDER — INSULIN ASPART 100 UNIT/ML IJ SOLN: 0.0000 [IU] | Freq: Every day | INTRAMUSCULAR | Status: DC

## 2024-02-02 MED ORDER — STERILE WATER FOR IRRIGATION IR SOLN
Status: DC | PRN
  Administered 2024-02-02: 1000 mL

## 2024-02-02 MED ORDER — FENTANYL CITRATE (PF) 100 MCG/2ML IJ SOLN
INTRAMUSCULAR | Status: DC | PRN
  Administered 2024-02-02 (×2): 50 ug via INTRAVENOUS

## 2024-02-02 MED ORDER — LISINOPRIL-HYDROCHLOROTHIAZIDE 20-12.5 MG PO TABS: 1.0000 | ORAL_TABLET | Freq: Every day | ORAL | Status: DC

## 2024-02-02 MED ORDER — OXYCODONE HCL 5 MG PO TABS
5.0000 mg | ORAL_TABLET | Freq: Once | ORAL | Status: AC
  Administered 2024-02-02: 5 mg via ORAL

## 2024-02-02 MED ORDER — SODIUM CHLORIDE 0.9 % IR SOLN
Status: DC | PRN
  Administered 2024-02-02: 1000 mL

## 2024-02-02 MED ORDER — INDOCYANINE GREEN 25 MG IV SOLR
2.5000 mg | Freq: Once | INTRAVENOUS | Status: AC
Start: 2024-02-02 — End: 2024-02-02
  Administered 2024-02-02: 2.5 mg via INTRAVENOUS
  Filled 2024-02-02: qty 10

## 2024-02-02 MED ORDER — HYDROCHLOROTHIAZIDE 12.5 MG PO TABS
12.5000 mg | ORAL_TABLET | Freq: Every day | ORAL | Status: DC
  Administered 2024-02-03: 12.5 mg via ORAL
  Filled 2024-02-02: qty 1

## 2024-02-02 MED ORDER — ACETAMINOPHEN 500 MG PO TABS: 500.0000 mg | ORAL_TABLET | Freq: Four times a day (QID) | ORAL | Status: DC

## 2024-02-02 MED ORDER — DIPHENHYDRAMINE HCL 12.5 MG/5ML PO ELIX: 12.5000 mg | ORAL_SOLUTION | Freq: Four times a day (QID) | ORAL | Status: DC | PRN

## 2024-02-02 MED ORDER — LACTATED RINGERS IR SOLN
Status: DC | PRN
  Administered 2024-02-02: 1000 mL

## 2024-02-02 MED ORDER — ORAL CARE MOUTH RINSE: 15.0000 mL | Freq: Once | OROMUCOSAL | Status: AC

## 2024-02-02 MED ORDER — ALPRAZOLAM 0.5 MG PO TABS
0.5000 mg | ORAL_TABLET | Freq: Three times a day (TID) | ORAL | Status: DC | PRN
  Administered 2024-02-02 – 2024-02-03 (×2): 0.5 mg via ORAL
  Filled 2024-02-02 (×2): qty 1

## 2024-02-02 MED ORDER — CEFAZOLIN SODIUM-DEXTROSE 2-4 GM/100ML-% IV SOLN
2.0000 g | INTRAVENOUS | Status: AC
  Administered 2024-02-02: 2 g via INTRAVENOUS
  Filled 2024-02-02: qty 100

## 2024-02-02 MED ORDER — DEXAMETHASONE SODIUM PHOSPHATE 10 MG/ML IJ SOLN
INTRAMUSCULAR | Status: DC | PRN
  Administered 2024-02-02: 8 mg via INTRAVENOUS

## 2024-02-02 MED ORDER — BUPIVACAINE-EPINEPHRINE (PF) 0.25% -1:200000 IJ SOLN
INTRAMUSCULAR | Status: AC
  Filled 2024-02-02: qty 30

## 2024-02-02 MED ORDER — INSULIN ASPART 100 UNIT/ML IJ SOLN
0.0000 [IU] | Freq: Three times a day (TID) | INTRAMUSCULAR | Status: DC
  Administered 2024-02-02: 8 [IU] via SUBCUTANEOUS
  Administered 2024-02-03: 3 [IU] via SUBCUTANEOUS

## 2024-02-02 MED ORDER — FENTANYL CITRATE PF 50 MCG/ML IJ SOSY
PREFILLED_SYRINGE | INTRAMUSCULAR | Status: AC
  Filled 2024-02-02: qty 1

## 2024-02-02 MED ORDER — METOPROLOL TARTRATE 5 MG/5ML IV SOLN: 5.0000 mg | Freq: Four times a day (QID) | INTRAVENOUS | Status: DC | PRN

## 2024-02-02 MED ORDER — IBUPROFEN 200 MG PO TABS
600.0000 mg | ORAL_TABLET | Freq: Four times a day (QID) | ORAL | Status: DC | PRN
  Administered 2024-02-02: 600 mg via ORAL
  Filled 2024-02-02: qty 3

## 2024-02-02 MED ORDER — ONDANSETRON HCL 4 MG/2ML IJ SOLN: 4.0000 mg | Freq: Four times a day (QID) | INTRAMUSCULAR | Status: DC | PRN

## 2024-02-02 MED ORDER — METOPROLOL SUCCINATE ER 50 MG PO TB24
50.0000 mg | ORAL_TABLET | Freq: Every day | ORAL | Status: DC
  Administered 2024-02-03: 50 mg via ORAL
  Filled 2024-02-02: qty 1

## 2024-02-02 MED ORDER — DIPHENHYDRAMINE HCL 50 MG/ML IJ SOLN: 12.5000 mg | Freq: Four times a day (QID) | INTRAMUSCULAR | Status: DC | PRN

## 2024-02-02 MED ORDER — PROPOFOL 10 MG/ML IV BOLUS
INTRAVENOUS | Status: DC | PRN
  Administered 2024-02-02: 90 mg via INTRAVENOUS

## 2024-02-02 MED ORDER — ONDANSETRON 4 MG PO TBDP: 4.0000 mg | ORAL_TABLET | Freq: Four times a day (QID) | ORAL | Status: DC | PRN

## 2024-02-02 MED ORDER — ROCURONIUM BROMIDE 10 MG/ML (PF) SYRINGE
PREFILLED_SYRINGE | INTRAVENOUS | Status: AC
  Filled 2024-02-02: qty 10

## 2024-02-02 MED ORDER — PANTOPRAZOLE SODIUM 40 MG PO TBEC
40.0000 mg | DELAYED_RELEASE_TABLET | Freq: Two times a day (BID) | ORAL | Status: DC
  Administered 2024-02-02 – 2024-02-03 (×2): 40 mg via ORAL
  Filled 2024-02-02 (×2): qty 1

## 2024-02-02 MED ORDER — HYDROMORPHONE HCL 1 MG/ML IJ SOLN: 0.5000 mg | INTRAMUSCULAR | Status: DC | PRN

## 2024-02-02 MED ORDER — FENTANYL CITRATE PF 50 MCG/ML IJ SOSY
25.0000 ug | PREFILLED_SYRINGE | INTRAMUSCULAR | Status: DC | PRN
  Administered 2024-02-02 (×3): 50 ug via INTRAVENOUS

## 2024-02-02 MED ORDER — TRAMADOL HCL 50 MG PO TABS: 50.0000 mg | ORAL_TABLET | Freq: Four times a day (QID) | ORAL | 0 refills | Status: AC | PRN

## 2024-02-02 MED ORDER — LISINOPRIL 20 MG PO TABS
20.0000 mg | ORAL_TABLET | Freq: Every day | ORAL | Status: DC
  Administered 2024-02-03: 20 mg via ORAL
  Filled 2024-02-02: qty 1

## 2024-02-02 MED ORDER — ROCURONIUM BROMIDE 10 MG/ML (PF) SYRINGE
PREFILLED_SYRINGE | INTRAVENOUS | Status: DC | PRN
  Administered 2024-02-02: 40 mg via INTRAVENOUS

## 2024-02-02 MED ORDER — SIMETHICONE 80 MG PO CHEW: 40.0000 mg | CHEWABLE_TABLET | Freq: Four times a day (QID) | ORAL | Status: DC | PRN

## 2024-02-02 MED ORDER — FENTANYL CITRATE (PF) 100 MCG/2ML IJ SOLN
INTRAMUSCULAR | Status: AC
  Filled 2024-02-02: qty 2

## 2024-02-02 MED ORDER — ONDANSETRON HCL 4 MG/2ML IJ SOLN
INTRAMUSCULAR | Status: DC | PRN
  Administered 2024-02-02: 4 mg via INTRAVENOUS

## 2024-02-02 MED ORDER — SIMVASTATIN 40 MG PO TABS
40.0000 mg | ORAL_TABLET | Freq: Every day | ORAL | Status: DC
  Administered 2024-02-03: 40 mg via ORAL
  Filled 2024-02-02: qty 1

## 2024-02-02 MED ORDER — OXYCODONE HCL 5 MG PO TABS
ORAL_TABLET | ORAL | Status: AC
  Filled 2024-02-02: qty 1

## 2024-02-02 MED ORDER — MELATONIN 3 MG PO TABS
3.0000 mg | ORAL_TABLET | Freq: Every evening | ORAL | Status: DC | PRN
Start: 2024-02-02 — End: 2024-02-03
  Administered 2024-02-02: 3 mg via ORAL
  Filled 2024-02-02: qty 1

## 2024-02-02 MED ORDER — KETOROLAC TROMETHAMINE 15 MG/ML IJ SOLN
INTRAMUSCULAR | Status: AC
Start: 2024-02-02 — End: 2024-02-02
  Filled 2024-02-02: qty 1

## 2024-02-02 MED ORDER — GLIPIZIDE ER 10 MG PO TB24
10.0000 mg | ORAL_TABLET | Freq: Every day | ORAL | Status: DC
  Administered 2024-02-03: 10 mg via ORAL
  Filled 2024-02-02: qty 1

## 2024-02-02 MED ORDER — GABAPENTIN 300 MG PO CAPS
300.0000 mg | ORAL_CAPSULE | Freq: Every day | ORAL | Status: DC
  Administered 2024-02-02: 300 mg via ORAL
  Filled 2024-02-02: qty 1

## 2024-02-02 MED ORDER — BUPIVACAINE-EPINEPHRINE 0.25% -1:200000 IJ SOLN
INTRAMUSCULAR | Status: DC | PRN
  Administered 2024-02-02: 30 mL

## 2024-02-02 SURGICAL SUPPLY — 39 items
APPLICATOR ARISTA FLEXITIP XL (MISCELLANEOUS) IMPLANT
BAG COUNTER SPONGE SURGICOUNT (BAG) IMPLANT
CABLE HIGH FREQUENCY MONO STRZ (ELECTRODE) ×1 IMPLANT
CHLORAPREP W/TINT 26 (MISCELLANEOUS) ×1 IMPLANT
CLIP APPLIE 5 13 M/L LIGAMAX5 (MISCELLANEOUS) ×1 IMPLANT
CLIP APPLIE ROT 10 11.4 M/L (STAPLE) IMPLANT
COVER MAYO STAND XLG (MISCELLANEOUS) ×1 IMPLANT
COVER SURGICAL LIGHT HANDLE (MISCELLANEOUS) ×1 IMPLANT
DERMABOND ADVANCED .7 DNX12 (GAUZE/BANDAGES/DRESSINGS) ×1 IMPLANT
DISSECTOR BLUNT TIP ENDO 5MM (MISCELLANEOUS) IMPLANT
DRAPE C-ARM 42X120 X-RAY (DRAPES) IMPLANT
DRSG TELFA 3X8 NADH STRL (GAUZE/BANDAGES/DRESSINGS) IMPLANT
ELECT PENCIL ROCKER SW 15FT (MISCELLANEOUS) ×1 IMPLANT
ELECT REM PT RETURN 15FT ADLT (MISCELLANEOUS) ×1 IMPLANT
GLOVE BIO SURGEON STRL SZ7.5 (GLOVE) ×1 IMPLANT
GLOVE INDICATOR 8.0 STRL GRN (GLOVE) ×1 IMPLANT
GOWN STRL REUS W/ TWL XL LVL3 (GOWN DISPOSABLE) ×2 IMPLANT
GRASPER SUT TROCAR 14GX15 (MISCELLANEOUS) IMPLANT
HEMOSTAT ARISTA ABSORB 3G PWDR (HEMOSTASIS) IMPLANT
HEMOSTAT SNOW SURGICEL 2X4 (HEMOSTASIS) IMPLANT
IRRIGATION SUCT STRKRFLW 2 WTP (MISCELLANEOUS) ×1 IMPLANT
KIT BASIN OR (CUSTOM PROCEDURE TRAY) ×1 IMPLANT
KIT TURNOVER KIT A (KITS) ×1 IMPLANT
NDL BIOPSY 14X6 SOFT TISS (NEEDLE) IMPLANT
NDL INSUFFLATION 14GA 120MM (NEEDLE) IMPLANT
NEEDLE BIOPSY 14X6 SOFT TISS (NEEDLE) ×1 IMPLANT
NEEDLE INSUFFLATION 14GA 120MM (NEEDLE) IMPLANT
SCISSORS LAP 5X35 DISP (ENDOMECHANICALS) ×1 IMPLANT
SET CHOLANGIOGRAPH MIX (MISCELLANEOUS) IMPLANT
SET TUBE SMOKE EVAC HIGH FLOW (TUBING) ×1 IMPLANT
SLEEVE ADV FIXATION 5X100MM (TROCAR) ×2 IMPLANT
SPIKE FLUID TRANSFER (MISCELLANEOUS) ×1 IMPLANT
SUT MNCRL AB 4-0 PS2 18 (SUTURE) ×1 IMPLANT
SYR 20ML ECCENTRIC (SYRINGE) ×1 IMPLANT
SYSTEM BAG RETRIEVAL 10MM (BASKET) ×1 IMPLANT
TOWEL OR 17X26 10 PK STRL BLUE (TOWEL DISPOSABLE) ×1 IMPLANT
TRAY LAPAROSCOPIC (CUSTOM PROCEDURE TRAY) ×1 IMPLANT
TROCAR ADV FIXATION 5X100MM (TROCAR) ×1 IMPLANT
TROCAR BALLN 12MMX100 BLUNT (TROCAR) ×1 IMPLANT

## 2024-02-02 NOTE — Anesthesia Postprocedure Evaluation (Signed)
 Anesthesia Post Note  Patient: Amber Gentry  Procedure(s) Performed: LAPAROSCOPIC CHOLECYSTECTOMY BIOPSY, LIVER     Patient location during evaluation: PACU Anesthesia Type: General Level of consciousness: awake and alert Pain management: pain level controlled Vital Signs Assessment: post-procedure vital signs reviewed and stable Respiratory status: spontaneous breathing, nonlabored ventilation, respiratory function stable and patient connected to nasal cannula oxygen Cardiovascular status: blood pressure returned to baseline and stable Postop Assessment: no apparent nausea or vomiting Anesthetic complications: no   No notable events documented.  Last Vitals:  Vitals:   02/02/24 1315 02/02/24 1330  BP: (!) 143/60   Pulse: (!) 55 (!) 58  Resp: 14 (!) 26  Temp:    SpO2: 99% 100%                 Franky JONETTA Bald

## 2024-02-02 NOTE — Op Note (Signed)
 02/02/2024 10:39 AM  PATIENT: Amber Gentry  83 y.o. female  Patient Care Team: Radiontchenko, Alexei, MD as PCP - General (Family Medicine)  PRE-OPERATIVE DIAGNOSIS: Symptomatic cholelithiasis, possible cirrhosis  POST-OPERATIVE DIAGNOSIS: Symptomatic cholelithiasis, macronodular liver cirrhosis  PROCEDURE:  1.  Laparoscopic cholecystectomy with indocyanine green cholangiography 2.  Liver biopsy, Tru-Cut core needle  SURGEON: Lonni Pizza, MD  ASSISTANT: Deward Foy, MD  An experienced assistant was required given the complexity of this procedure and the standard of surgical care. My assistant helped with exposure through counter tension, suctioning, ligation and retraction to better visualize the surgical field in the setting of a fibrotic, macronodular cirrhotic liver.  Wherever I use the term we in the report, my assistant actively helped me with that portion of the procedure.    ANESTHESIA: General endotracheal  EBL: 10 mL  DRAINS: None  SPECIMEN:  Gallbladder Liver biopsy, random  COUNTS: Sponge, needle and instrument counts were reported correct x2 at the conclusion of the operation  DISPOSITION: PACU in satisfactory condition  COMPLICATIONS: None  FINDINGS:  Grossly cirrhotic appearing liver with macronodular cirrhosis.  Gallbladder without significant pathologic thickening of the wall or findings to suggest cholecystitis.  ICG cholangiography demonstrated uptake by the liver and excretion into the biliary system with filling of the gallbladder, cystic duct, and duodenum.  There was some bile spillage during the removal of the gallbladder from the liver bed and a few small stones did freely spill but these were all controlled with suction/irrigation. Random liver biopsy was taken on the periphery of the liver to aid in establishing the diagnosis of cirrhosis. Reducible right inguinal hernia, likely indirect  DESCRIPTION:   The patient was identified &  brought into the operating room. She was then positioned supine on the OR table. SCDs were in place and active during the entire case. She then underwent general endotracheal anesthesia. Pressure points were padded. Hair on the abdomen was clipped by the OR team. The abdomen was prepped and draped in the standard sterile fashion. Antibiotics were administered. A surgical timeout was performed and confirmed our plan.   A periumbilical incision was made. The umbilical stalk was grasped and retracted outwardly. The supraumbilical fascia was identified and incised. The peritoneal cavity was gently entered bluntly. A purse-string 0 Vicryl suture was placed. The Hasson cannula was inserted into the peritoneal cavity and insufflation with CO2 commenced to . A laparoscope was inserted into the peritoneal cavity and inspection confirmed no evidence of trocar site complications. The patient was then positioned in reverse Trendelenburg with slight left side down. 3 additional 5mm trocars were placed along the right subcostal line - one 5mm port in mid subcostal region, another 5mm port in the right flank near the anterior axillary line, and a third 5mm port in the left subxiphoid region obliquely near the falciform ligament.  The liver and gallbladder were inspected.  Liver noted below with findings of macronodular cirrhosis.  Gallbladder is somewhat small relative to the size of her liver.  The liver is fibrotic which does make retraction slightly more challenging.  We were able to however do this without significant difficulty.    The gallbladder fundus was grasped and elevated cephalad. An additional grasper was then placed on the infundibulum of the gallbladder and the infundibulum was retracted laterally. Staying high on the gallbladder, the peritoneum on both sides of the gallbladder was opened with hook cautery. Gentle blunt dissection was then employed with a [place]  dissector working down into [employer]  triangle. The cystic duct was identified and carefully circumferentially dissected. The cystic artery was also identified and carefully circumferentially dissected. The space between the cystic artery and hepatocystic plate was developed such that a good view of the liver could be seen through a window medial to the cystic artery. The triangle of [place] had been cleared of all fibrofatty tissue. At this point, a critical view of safety was achieved and the only structures visualized was the skeletonized cystic duct laterally, the skeletonized cystic artery and the liver through the window medial to the artery.  A small, diminutive posterior cystic artery was noted  Under near-infrared light, indocyanine green cholangiography demonstrates uptake by the liver and excretion into the biliary system.  There is tracer seen in the hepatoduodenal ligament extending down and filling the duodenum.  There is also tracer seen extending up into the cystic duct and filling her gallbladder.  There is no tracer activity seen within the cystic artery or the diminutive posterior cystic branch.  This was consistent with a normal indocyanine green cholangiography.  The cystic duct and artery were clipped with 2 clips on the patient side and 1 clip on the specimen side. The cystic duct and artery were then divided.  The small posterior cystic division was also controlled similarly.  The gallbladder was then freed from its remaining attachments to the liver using electrocautery and placed into an endocatch bag.  Of note, there was some spillage of bile during removal from the hepatocystic plate which was controlled the suction irrigation tool.  A few 1 to 2 mm stones were also noted which were able to be evacuated with the suction irrigator.  The RUQ was gently irrigated with sterile saline. Hemostasis was then verified. The clips were in good position; the gallbladder fossa was dry.   A Tru-Cut core needle was brought onto the  field.  A very small stab incision was created in the right upper quadrant with the 11 blade.  The biopsy tool was then inserted into the abdominal cavity under direct visualization.  The periphery of the liver was then biopsied near the falciform ligament.  We are able to confirm adequate tissue acquisition.  The biopsy site was made hemostatic with application of electrocautery without difficulty.  The area was then irrigated.  Hemostasis was then again verified.  The rest of the abdomen was inspected no injury nor bleeding elsewhere was identified.  We were able to identify that she had a likely indirect right inguinal hernia which is reducible.  See photo.    The endocatch bag containing the gallbladder was then removed from the umbilical port site and passed off as specimen.  Gallbladder fossa liver biopsy site are inspected and noted to be hemostatic.  Reinspection of the right upper quadrant also demonstrates no evident gallstones remaining in situ.  The RUQ ports were removed under direct visualization and noted to be hemostatic. The umbilical fascia was then closed using the 0 Vicryl purse-string suture. The fascia was palpated and noted to be completely closed. The skin of all incision sites was approximated with 4-0 monocryl subcuticular suture and dermabond applied. She was then awakened from anesthesia, extubated, and transferred to a stretcher for transport to PACU in satisfactory condition.

## 2024-02-02 NOTE — Transfer of Care (Signed)
 Immediate Anesthesia Transfer of Care Note  Patient: Amber Gentry  Procedure(s) Performed: LAPAROSCOPIC CHOLECYSTECTOMY BIOPSY, LIVER  Patient Location: PACU  Anesthesia Type:General  Level of Consciousness: drowsy  Airway & Oxygen Therapy: Patient Spontanous Breathing and Patient connected to face mask oxygen  Post-op Assessment: Report given to RN and Post -op Vital signs reviewed and stable  Post vital signs: Reviewed and stable  Last Vitals:  Vitals Value Taken Time  BP 155/67 02/02/24 10:50  Temp    Pulse 64 02/02/24 10:52  Resp 10 02/02/24 10:52  SpO2 100 % 02/02/24 10:52  Vitals shown include unfiled device data.  Last Pain:  Vitals:   02/02/24 0759  TempSrc:   PainSc: 0-No pain         Complications: No notable events documented.

## 2024-02-02 NOTE — H&P (Signed)
 CC: Here today for surgery  HPI: Amber Gentry is an 83 y.o. female with history of HTN, DM, HLD, iron deficiency anemia whom is seen in the office today as a referral by Dr. Radiontchenko for evaluation of possible symptomatic cholelithiasis, right inguinofemoral bulge/hernia.   She is here today with her daughter. Much of her care has been derived through the [facility] but her daughter lives here in [place].  She reports for at least the last year she has had intermittent somewhat vague upper abdominal discomfort and pain. She notes nausea/vomiting and these types of pains particularly if she has any greasy or fatty foods. For these reasons, she has minimizing [place] meat she eats and avoids all fatty/greasy foods. With this maneuver, she has reduced the frequency of these types of attacks. When they do occur, they will last for hours. She denies any known history of pancreatitis. She denies any yellowing of her skin/eyes or dark-colored urine. She denies any pale stool.  With regards to the potential groin hernia, she does not report any abdominal pain at present this location. She does report many months ago having some vague discomfort that she thought could be related to this but was not clear based on her history of it was actually this or her gallbladder. She is unsure if she is ever had a bulge in this region.  Imaging through [facility] -we can see the pictures but have requested fax copies of the report. There does appear to be gallstones present within the gallbladder (CT A/P 06/20/23)  CT A/P ([facility], 10/18/2022): 1. No evidence of acute intra-abdominal abnormality to explain patient's pain 2. Cirrhotic morphology of liver with splenomegaly possibly related portal hypertension. 3. Cholelithiasis 4. 3.9 cm low-density lesion the right adnexa. Benign-appearing adnexal cyst of greater than 3 cm. 5. Moderate right inguinal hernia  She has previously a Careers adviser through the  [facility] system-Dr. Christell Mater. He had seen her back in March, 2024. He had recommended/scheduled her for cholecystectomy with this was never carried out.  CT Pelvis 07/2023 - stable right adnexal cyst  She has also seen Dr. Viktoria with gynecologic oncology through [facility] 07/2023. They are planning to repeat imaging ~07/2024 but have not done any surgery as of yet with regards to her adnexal cyst.  She denies any changes in health or health history since we met in the office. No new medications/allergies. She states she is ready for surgery today.    PMH: HTN, DM, HLD, iron deficiency anemia (does receive occasional injections of darbepoetin)  PSH: Right wrist/hand surgery, 2019; bunion and hammertoe approximately 2010. She specifically denies any prior abdominal or pelvic surgical history including tubal ligations or C-sections.  FHx: Denies any known family history of colorectal, breast, endometrial or ovarian cancer  Social Hx: Denies use of tobacco/EtOH/illicit drug. She is happily retired. 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 She is here today with her daughter. She does live independently. Her daughter does assist in her care periodically.    Past Medical History:  Diagnosis Date   Anxiety and depression    Arthritis    back, arms (03/13/2018)   Chronic kidney disease    Chronic lower back pain    Degeneration of uterine fibroid 2024   Needs yearly US  or MRI   GERD (gastroesophageal reflux disease)    Hyperlipemia    Hypertension    Iron deficiency anemia    I'm getting iron infusions (03/13/2018)   Myocardial infarction (HCC) ~  1996   little one   Pneumonia ~ 1998 X 1   Right ovarian cyst    Needs yearly US  or MRI   Type II diabetes mellitus (HCC)     Past Surgical History:  Procedure Laterality Date   BUNIONECTOMY WITH HAMMERTOE RECONSTRUCTION Left    CATARACT EXTRACTION, BILATERAL Bilateral    DILATION AND CURETTAGE OF UTERUS     shortly  after menopause (lat4 1900s, maybe for spotting)   EYE SURGERY Bilateral    after cataract OR   FRACTURE SURGERY     PERCUTANEOUS PINNING PHALANX FRACTURE OF HAND Right 2017   took pins out after ~ 6 wks   TONSILLECTOMY     TUMOR EXCISION  1970s X 2   off my back   TUMOR EXCISION Left    jaw    Family History  Problem Relation Age of Onset   Stroke Mother    Diabetes Mother    Heart disease Mother    Stroke Father    Heart disease Brother    Stroke Brother    Heart disease Brother     Social:  reports that she has never smoked. She has never used smokeless tobacco. She reports that she does not currently use alcohol. She reports that she does not use drugs.  Allergies:  Allergies  Allergen Reactions   Acetaminophen  Other (See Comments)    ELEVATED LIVER ENZYME     Percocet [Oxycodone-Acetaminophen ] Nausea Only    Medications: I have reviewed the patient's current medications.  Results for orders placed or performed during the hospital encounter of 02/02/24 (from the past 48 hours)  Glucose, capillary     Status: Abnormal   Collection Time: 02/02/24  7:53 AM  Result Value Ref Range   Glucose-Capillary 136 (H) 70 - 99 mg/dL    Comment: Glucose reference range applies only to samples taken after fasting for at least 8 hours.   Comment 1 Notify RN   Basic metabolic panel     Status: Abnormal   Collection Time: 02/02/24  8:37 AM  Result Value Ref Range   Sodium 139 135 - 145 mmol/L   Potassium 4.3 3.5 - 5.1 mmol/L   Chloride 104 98 - 111 mmol/L   CO2 27 22 - 32 mmol/L   Glucose, Bld 141 (H) 70 - 99 mg/dL    Comment: Glucose reference range applies only to samples taken after fasting for at least 8 hours.   BUN 17 8 - 23 mg/dL   Creatinine, Ser 9.03 0.44 - 1.00 mg/dL   Calcium 8.7 (L) 8.9 - 10.3 mg/dL   GFR, Estimated 59 (L) >60 mL/min    Comment: (NOTE) Calculated using the CKD-EPI Creatinine Equation (2021)    Anion gap 8 5 - 15    Comment: Performed  at [facility], [address], [place], [place] [address]    No results found.   PE Blood pressure (!) 167/84, pulse (!) 59, temperature 98.7 F (37.1 C), temperature source Oral, resp. rate 16, height 5' 5 (1.651 m), weight 68.9 kg, SpO2 96%. Constitutional: NAD; conversant Eyes: Moist conjunctiva; no lid lag; anicteric Lungs: Normal respiratory effort CV: RRR GI: Abd soft, NT/ND Psychiatric: Appropriate affect  Results for orders placed or performed during the hospital encounter of 02/02/24 (from the past 48 hours)  Glucose, capillary     Status: Abnormal   Collection Time: 02/02/24  7:53 AM  Result Value Ref Range   Glucose-Capillary 136 (H) 70 -  99 mg/dL    Comment: Glucose reference range applies only to samples taken after fasting for at least 8 hours.   Comment 1 Notify RN   Basic metabolic panel     Status: Abnormal   Collection Time: 02/02/24  8:37 AM  Result Value Ref Range   Sodium 139 135 - 145 mmol/L   Potassium 4.3 3.5 - 5.1 mmol/L   Chloride 104 98 - 111 mmol/L   CO2 27 22 - 32 mmol/L   Glucose, Bld 141 (H) 70 - 99 mg/dL    Comment: Glucose reference range applies only to samples taken after fasting for at least 8 hours.   BUN 17 8 - 23 mg/dL   Creatinine, Ser 9.03 0.44 - 1.00 mg/dL   Calcium 8.7 (L) 8.9 - 10.3 mg/dL   GFR, Estimated 59 (L) >60 mL/min    Comment: (NOTE) Calculated using the CKD-EPI Creatinine Equation (2021)    Anion gap 8 5 - 15    Comment: Performed at [facility], [address], [place], [place] [address]    No results found.  A/P: Tanji Storrs is an 83 y.o. female with hx of HTN, DM, HLD, iron deficiency anemia (does receive occasional injections of darbepoetin) here for evaluation of symptomatic cholelithiasis; also with right inguinofemoral hernia  CT 10/2022 - confirmed cholelithiasis; some enlargement of the spleen but there are no evident varices.  -Medical clearance from her PCP,  Dr. Elvera Radiontchenko  - There was some CT evidence of splenomegaly but no evident varices were noted. She does report a remote history of being evaluated for this including a liver biopsy and was told she did not have any findings concerning for cirrhosis at that time.  -The anatomy and physiology of the hepatobiliary system was discussed with the patient and her daughter with associated pictures-laparoscopic gallbladder surgery handbook, publisher Krames, 11 pgs. We also spent time reviewing the pathophysiology of gallbladder disease. -The options for treatment were discussed including ongoing observation which carries some risk of subsequent gallbladder complications (infection, pancreatitis, choledocholithiasis, etc). We reviewed surgery as the most definitive treatment option moving forward - laparoscopic cholecystectomy with indocyanine green cholangiography; possible liver biopsy  -The planned procedure, material risks (including, but not limited to, pain, bleeding, infection, scarring, need for blood transfusion, damage to surrounding structures- blood vessels/nerves/viscus/organs, damage to bile duct, bile leak, chronic diarrhea, conversion to a 'subtotal' cholecystectomy and general expectations therein, post cholecystectomy diarrhea, need for additional procedures, hernia, worsening of pre-existing medical conditions, pancreatitis, pneumonia, heart attack, stroke, death) benefits and alternatives to surgery were discussed at length. I noted a good probability that the procedure would help improve their symptoms. The patient's questions were answered to her and her daughter's satisfaction, they voiced understanding and they elected to proceed with surgery. Additionally, we discussed typical postoperative expectations and the recovery process.  -With regards to her inguinofemoral bulge/hernia, it is asymptomatic at present. It does sound like she may have had 1 bout of symptoms remotely. That  said, would not proceed with any surgery at present until we at least addressed the gallbladder issue. We did discuss an increased risk for perioperative complications particular hernia repair in this region with the placement of mesh at the same time as a biliary type procedure. We therefore discussed a staged type approach. It does appear that her gallbladder symptoms are certainly the leading cause of issues for her at present.   Amber Pizza, MD [facility], A [facility]

## 2024-02-02 NOTE — Discharge Instructions (Addendum)
 POST OP INSTRUCTIONS  DIET: As tolerated. Follow a light bland diet the first 24 hours after arrival home, such as soup, liquids, crackers, etc.  Be sure to include lots of fluids daily.  Avoid fast food or heavy meals as your are more likely to get nauseated.  Eat a low fat the next few days after surgery.  Take your usually prescribed home medications unless otherwise directed.  PAIN CONTROL: Pain is best controlled by a usual combination of three different methods TOGETHER: Ice/Heat Over the counter pain medication Prescription pain medication Most patients will experience some swelling and bruising around the surgical site.  Ice packs or heating pads (30-60 minutes up to 6 times a day) will help. Some people prefer to use ice alone, heat alone, alternating between ice & heat.  Experiment to what works for you.  Swelling and bruising can take several weeks to resolve.   It is helpful to take an over-the-counter pain medication regularly for the first few weeks: Acetaminophen  (Tylenol ) - you may take 650mg  every 6 hours as needed. You can take this with motrin  as they act differently on the body. If you are taking a narcotic pain medication that has acetaminophen  in it, do not take over the counter tylenol  at the same time. A  prescription for pain medication should be given to you upon discharge.  Take your pain medication as prescribed if your pain is not adequatly controlled with the over-the-counter pain reliefs mentioned above. Given your history of some kidney impairment, avoid NSAIDs (ibuprofen , motrin , alleve, advil , etc)  Avoid getting constipated.  Between the surgery and the pain medications, it is common to experience some constipation.  Increasing fluid intake and taking a fiber supplement (such as Metamucil, Citrucel, FiberCon, MiraLax, etc) 1-2 times a day regularly will usually help prevent this problem from occurring.  A mild laxative (prune juice, Milk of Magnesia, MiraLax, etc)  should be taken according to package directions if there are no bowel movements after 48 hours.    Dressing: Your incisions are covered in Dermabond which is like sterile superglue for the skin. This will come off on it's own in a couple weeks. It is waterproof and you may bathe normally starting the day after your surgery in a shower. Avoid baths/pools/lakes/oceans until your wounds have fully healed.  ACTIVITIES as tolerated:   Avoid heavy lifting (>10lbs or 1 gallon of milk) for the next 6 weeks. You may resume regular (light) daily activities beginning the next day--such as daily self-care, walking, climbing stairs--gradually increasing activities as tolerated.  If you can walk 30 minutes without difficulty, it is safe to try more intense activity such as jogging, treadmill, bicycling, low-impact aerobics.  DO NOT PUSH THROUGH PAIN.  Let pain be your guide: If it hurts to do something, don't do it. You may drive when you are no longer taking prescription pain medication, you can comfortably wear a seatbelt, and you can safely maneuver your car and apply brakes.   FOLLOW UP in our office Please call [facility] at [phone] to set up an appointment to see your surgeon in the office for a follow-up appointment approximately 2 weeks after your surgery. Make sure that you call for this appointment the day you arrive home to insure a convenient appointment time.  9. If you have disability or family leave forms that need to be completed, you may have them completed by your primary care physician's office; for return to work instructions, please ask our  office staff and they will be happy to assist you in obtaining this documentation   When to call us  [phone]: Poor pain control Reactions / problems with new medications (rash/itching, etc)  Fever over 101.5 F (38.5 C) Inability to urinate Nausea/vomiting Worsening swelling or bruising Continued bleeding from incision. Increased pain,  redness, or drainage from the incision  The clinic staff is available to answer your questions during regular business hours (8:30am-5pm).  Please don't hesitate to call and ask to speak to one of our nurses for clinical concerns.   A surgeon from [facility] is always on call at the hospitals   If you have a medical emergency, go to the nearest emergency room or call 911.  [facility] [address], [address], [place], [place]  [address] MAIN: [phone] FAX: [phone] [email or web address]

## 2024-02-02 NOTE — Anesthesia Procedure Notes (Signed)
 Procedure Name: Intubation Date/Time: 02/02/2024 9:36 AM  Performed by: Therisa Doyal CROME, CRNAPre-anesthesia Checklist: Patient identified, Emergency Drugs available, Suction available and Patient being monitored Patient Re-evaluated:Patient Re-evaluated prior to induction Oxygen Delivery Method: Circle system utilized Preoxygenation: Pre-oxygenation with 100% oxygen Induction Type: IV induction Ventilation: Mask ventilation without difficulty Laryngoscope Size: Miller and 2 Grade View: Grade I Tube type: Oral Tube size: 7.5 mm Number of attempts: 1 Airway Equipment and Method: Stylet and Oral airway Placement Confirmation: ETT inserted through vocal cords under direct vision, positive ETCO2 and breath sounds checked- equal and bilateral Secured at: 19 cm Tube secured with: Tape Dental Injury: Teeth and Oropharynx as per pre-operative assessment

## 2024-02-02 NOTE — Progress Notes (Signed)
 Seen/evaluated in PACU this afternoon. Clinically looks well. She notes discomfort particularly with deep inspiration at her lateral most port sites. She feels that if she were to go home today, she would be back in the ER due to the discomfort.   AF VS normal -- HR 59, BP 124/53, normal saturations on room air   NAD, comfortable Abdomen is soft, not significantly distended; mild to moderate tenderness RUQ at lateral 2 port sites, no significant bruising at present. Wounds dry.  Will plan to observe overnight Diabetic diet; SSI ordered AM labs SCDs; will hold off on chemical dvt ppx given cirrhotic findings and now recent liver bx  Port site soreness in her particular case not unexpected given increased effort necessary for retraction in setting of enlarged, fibrotic/less mobile liver.  Anticipate probable D/C tomorrow if doing well.   Daughter updated on above over the phone this afternoon by nursing; I have attempted to reach out to her daughter Lolita over the phone but have reached generic voicemail   Lonni Pizza, MD [facility], [facility]

## 2024-02-02 NOTE — Anesthesia Preprocedure Evaluation (Addendum)
 Anesthesia Evaluation  Patient identified by MRN, date of birth, ID band Patient awake    Reviewed: Allergy & Precautions, NPO status , Patient's Chart, lab work & pertinent test results  Airway Mallampati: II  TM Distance: >3 FB Neck ROM: Full    Dental  (+) Caps, Missing,    Pulmonary pneumonia, resolved   breath sounds clear to auscultation       Cardiovascular hypertension, Pt. on medications and Pt. on home beta blockers + Past MI   Rhythm:Regular Rate:Normal     Neuro/Psych  PSYCHIATRIC DISORDERS Anxiety Depression    negative neurological ROS     GI/Hepatic Neg liver ROS,GERD  Medicated,,  Endo/Other  diabetes, Type 2, Oral Hypoglycemic Agents, Insulin  Dependent    Renal/GU Renal disease     Musculoskeletal  (+) Arthritis ,    Abdominal   Peds  Hematology  (+) Blood dyscrasia, anemia   Anesthesia Other Findings   Reproductive/Obstetrics                             Anesthesia Physical Anesthesia Plan  ASA: 3  Anesthesia Plan: General   Post-op Pain Management:    Induction: Intravenous  PONV Risk Score and Plan: 4 or greater and Ondansetron , Dexamethasone and Treatment may vary due to age or medical condition  Airway Management Planned: Oral ETT  Additional Equipment: None  Intra-op Plan:   Post-operative Plan: Extubation in OR  Informed Consent: I have reviewed the patients History and Physical, chart, labs and discussed the procedure including the risks, benefits and alternatives for the proposed anesthesia with the patient or authorized representative who has indicated his/her understanding and acceptance.     Dental advisory given  Plan Discussed with: CRNA  Anesthesia Plan Comments:        Anesthesia Quick Evaluation

## 2024-02-03 ENCOUNTER — Encounter (HOSPITAL_COMMUNITY): Payer: Self-pay | Admitting: Surgery

## 2024-02-03 DIAGNOSIS — K8010 Calculus of gallbladder with chronic cholecystitis without obstruction: Secondary | ICD-10-CM | POA: Diagnosis not present

## 2024-02-03 LAB — COMPREHENSIVE METABOLIC PANEL WITH GFR
ALT: 15 U/L (ref 0–44)
AST: 26 U/L (ref 15–41)
Albumin: 2.7 g/dL — ABNORMAL LOW (ref 3.5–5.0)
Alkaline Phosphatase: 123 U/L (ref 38–126)
Anion gap: 8 (ref 5–15)
BUN: 27 mg/dL — ABNORMAL HIGH (ref 8–23)
CO2: 28 mmol/L (ref 22–32)
Calcium: 8.3 mg/dL — ABNORMAL LOW (ref 8.9–10.3)
Chloride: 101 mmol/L (ref 98–111)
Creatinine, Ser: 1.42 mg/dL — ABNORMAL HIGH (ref 0.44–1.00)
GFR, Estimated: 37 mL/min — ABNORMAL LOW (ref >60)
Glucose, Bld: 156 mg/dL — ABNORMAL HIGH (ref 70–99)
Potassium: 4.3 mmol/L (ref 3.5–5.1)
Sodium: 137 mmol/L (ref 135–145)
Total Bilirubin: 0.5 mg/dL (ref 0.0–1.2)
Total Protein: 5.2 g/dL — ABNORMAL LOW (ref 6.5–8.1)

## 2024-02-03 LAB — CBC WITH DIFFERENTIAL/PLATELET
Abs Immature Granulocytes: 0.02 10*3/uL (ref 0.00–0.07)
Basophils Absolute: 0 10*3/uL (ref 0.0–0.1)
Basophils Relative: 0 %
Eosinophils Absolute: 0 10*3/uL (ref 0.0–0.5)
Eosinophils Relative: 0 %
HCT: 27.5 % — ABNORMAL LOW (ref 36.0–46.0)
Hemoglobin: 8.3 g/dL — ABNORMAL LOW (ref 12.0–15.0)
Immature Granulocytes: 0 %
Lymphocytes Relative: 26 %
Lymphs Abs: 1.2 10*3/uL (ref 0.7–4.0)
MCH: 27.5 pg (ref 26.0–34.0)
MCHC: 30.2 g/dL (ref 30.0–36.0)
MCV: 91.1 fL (ref 80.0–100.0)
Monocytes Absolute: 0.5 10*3/uL (ref 0.1–1.0)
Monocytes Relative: 10 %
Neutro Abs: 3 10*3/uL (ref 1.7–7.7)
Neutrophils Relative %: 64 %
Platelets: 88 10*3/uL — ABNORMAL LOW (ref 150–400)
RBC: 3.02 MIL/uL — ABNORMAL LOW (ref 3.87–5.11)
RDW: 19.7 % — ABNORMAL HIGH (ref 11.5–15.5)
WBC: 4.7 10*3/uL (ref 4.0–10.5)
nRBC: 0 % (ref 0.0–0.2)

## 2024-02-03 LAB — HEMOGLOBIN AND HEMATOCRIT, BLOOD
HCT: 30.1 % — ABNORMAL LOW (ref 36.0–46.0)
Hemoglobin: 9.1 g/dL — ABNORMAL LOW (ref 12.0–15.0)

## 2024-02-03 LAB — SURGICAL PATHOLOGY

## 2024-02-03 LAB — GLUCOSE, CAPILLARY
Glucose-Capillary: 110 mg/dL — ABNORMAL HIGH (ref 70–99)
Glucose-Capillary: 189 mg/dL — ABNORMAL HIGH (ref 70–99)

## 2024-02-03 MED ORDER — DOCUSATE SODIUM 100 MG PO CAPS: 200.0000 mg | ORAL_CAPSULE | Freq: Two times a day (BID) | ORAL | Status: AC | PRN

## 2024-02-03 MED ORDER — IBUPROFEN 600 MG PO TABS: 600.0000 mg | ORAL_TABLET | Freq: Four times a day (QID) | ORAL | Status: AC | PRN

## 2024-02-03 MED ORDER — ACETAMINOPHEN 500 MG PO TABS: 500.0000 mg | ORAL_TABLET | Freq: Four times a day (QID) | ORAL | Status: AC

## 2024-02-03 NOTE — Progress Notes (Signed)
  Subjective No acute events. Feeling well. Up in chair, ate breakfast. Some soreness at lateralmost incision. Denies any pain elsewhere. Denies n/v.  Objective: Vital signs in last 24 hours: Temp:  [97.4 F (36.3 C)-98.7 F (37.1 C)] 97.4 F (36.3 C) (07/01 0845) Pulse Rate:  [53-66] 60 (07/01 0845) Resp:  [8-26] 18 (07/01 0845) BP: (102-177)/(39-126) 130/53 (07/01 0845) SpO2:  [92 %-100 %] 100 % (07/01 0845) Last BM Date : 02/02/24  Intake/Output from previous day: 06/30 0701 - 07/01 0700 In: 1582.9 [P.O.:300; I.V.:1182.9; IV Piggyback:100] Out: 10 [Blood:10] Intake/Output this shift: Total I/O In: 120 [P.O.:120] Out: -   Gen: NAD, comfortable CV: RRR Pulm: Normal work of breathing Abd: Soft, NT/ND. Incisions c/d/I without erythema nor drainage. Ext: SCDs in place  Lab Results: CBC  Recent Labs    02/03/24 0513  WBC 4.7  HGB 8.3*  HCT 27.5*  PLT 88*   BMET Recent Labs    02/02/24 0837 02/03/24 0513  NA 139 137  K 4.3 4.3  CL 104 101  CO2 27 28  GLUCOSE 141* 156*  BUN 17 27*  CREATININE 0.96 1.42*  CALCIUM 8.7* 8.3*   PT/INR No results for input(s): LABPROT, INR in the last 72 hours. ABG No results for input(s): PHART, HCO3 in the last 72 hours.  Invalid input(s): PCO2, PO2  Studies/Results:  Anti-infectives: Anti-infectives (From admission, onward)    Start     Dose/Rate Route Frequency Ordered Stop   02/02/24 0915  ceFAZolin (ANCEF) IVPB 2g/100 mL premix        2 g 200 mL/hr over 30 Minutes Intravenous On call to O.R. 02/02/24 0901 02/02/24 0947        Assessment/Plan: Patient Active Problem List   Diagnosis Date Noted   S/P laparoscopic cholecystectomy 02/02/2024   Adnexal mass 07/17/2023   Degeneration of uterine fibroid 07/17/2023   Iron deficiency anemia 07/17/2023   Thrombocytopenia (HCC) 03/14/2018   Abscess of buttock, right 03/14/2018   Cellulitis and abscess of buttock 03/13/2018   AKI (acute kidney injury)  (HCC) 03/13/2018   Diabetes mellitus type 2, uncontrolled 03/13/2018   Anemia in other chronic diseases classified elsewhere 03/13/2018   Lumbosacral strain 06/19/2016   Trochanteric bursitis 06/19/2016   s/p Procedure(s): LAPAROSCOPIC CHOLECYSTECTOMY BIOPSY, LIVER 02/02/2024  - Doing well - Ambulate - Diet as tolerated - Hgb 8.3; suspect some of this being dilutional. Will repeat H&H 6 hrs later to ensure stability. If stable, anticipate discharge later today.  - All of her questions were answered to her satisfaction, she expressed understanding, and agreement with the plan.  We did spend time again today reviewing her procedure, findings, and long-term plans.   LOS: 0 days     Amber Pizza, MD [facility], [facility]

## 2024-02-03 NOTE — Care Management Obs Status (Signed)
 MEDICARE OBSERVATION STATUS NOTIFICATION   Patient Details  Name: Evanee Lubrano MRN: [id number] Date of Birth: 14-Nov-1940   Medicare Observation Status Notification Given:  Yes    MahabirNathanel, RN 02/03/2024, 10:13 AM

## 2024-02-03 NOTE — TOC Transition Note (Signed)
 Transition of Care [facility]) - Discharge Note   Patient Details  Name: Amber Gentry MRN: [id number] Date of Birth: [birth year]  Transition of Care [facility]) CM/SW Contact:  Bascom Service, RN Phone Number: 02/03/2024, 10:17 AM   Clinical Narrative:  d/c home     Final next level of care: Home/Self Care Barriers to Discharge: No Barriers Identified   Patient Goals and CMS Choice       [place] ownership interest in [facility].provided to:: Adult Children    Discharge Placement                       Discharge Plan and Services Additional resources added to the After Visit Summary for     Discharge Planning Services: CM Consult Post Acute Care Choice: Resumption of Svcs/PTA Provider                               Social Drivers of Health (SDOH) Interventions SDOH Screenings   Food Insecurity: No Food Insecurity (02/02/2024)  Housing: Low Risk  (02/02/2024)  Transportation Needs: No Transportation Needs (02/02/2024)  Utilities: Not At Risk (02/02/2024)  Financial Resource Strain: Low Risk  (10/15/2023)   Received from [facility]  Physical Activity: Insufficiently Active (07/07/2023)   Received from [facility]  Social Connections: Moderately Integrated (02/02/2024)  Stress: No Stress Concern Present (07/07/2023)   Received from [facility]  Tobacco Use: Low Risk  (02/02/2024)     Readmission Risk Interventions     No data to display

## 2024-02-03 NOTE — TOC Transition Note (Signed)
 Transition of Care [facility]) - Discharge Note   Patient Details  Name: Amber Gentry MRN: [id number] Date of Birth: 06/02/1941  Transition of Care [facility]) CM/SW Contact:  Bascom Service, RN Phone Number: 02/03/2024, 12:20 PM   Clinical Narrative: d/c home.      Final next level of care: Home/Self Care Barriers to Discharge: No Barriers Identified   Patient Goals and CMS Choice       [place] ownership interest in [facility].provided to:: Adult Children    Discharge Placement                       Discharge Plan and Services Additional resources added to the After Visit Summary for     Discharge Planning Services: CM Consult Post Acute Care Choice: Resumption of Svcs/PTA Provider                               Social Drivers of Health (SDOH) Interventions SDOH Screenings   Food Insecurity: No Food Insecurity (02/02/2024)  Housing: Low Risk  (02/02/2024)  Transportation Needs: No Transportation Needs (02/02/2024)  Utilities: Not At Risk (02/02/2024)  Financial Resource Strain: Low Risk  (10/15/2023)   Received from [facility]  Physical Activity: Insufficiently Active (07/07/2023)   Received from [facility]  Social Connections: Moderately Integrated (02/02/2024)  Stress: No Stress Concern Present (07/07/2023)   Received from [facility]  Tobacco Use: Low Risk  (02/02/2024)     Readmission Risk Interventions     No data to display

## 2024-02-04 NOTE — Discharge Summary (Signed)
 Patient ID: Amber Gentry MRN: [id number] DOB/AGE: 83-Aug-1942 83 y.o.  Admit date: 02/02/2024 Discharge date: 02/04/2024  Discharge Diagnoses Patient Active Problem List   Diagnosis Date Noted   S/P laparoscopic cholecystectomy 02/02/2024   Adnexal mass 07/17/2023   Degeneration of uterine fibroid 07/17/2023   Iron deficiency anemia 07/17/2023   Thrombocytopenia (HCC) 03/14/2018   Abscess of buttock, right 03/14/2018   Cellulitis and abscess of buttock 03/13/2018   AKI (acute kidney injury) (HCC) 03/13/2018   Diabetes mellitus type 2, uncontrolled 03/13/2018   Anemia in other chronic diseases classified elsewhere 03/13/2018   Lumbosacral strain 06/19/2016   Trochanteric bursitis 06/19/2016    Consultants none  Procedures OR 02/02/24 - laparoscopic cholecystectomy, liver biopsy  Hospital Course: She was admitted for primarily pain control reasons.  She had a fair amount discomfort from her lateralmost port sites likely related to the degree of retraction required to manipulate her cirrhotic/fibrotic liver.  On postop day 1, her pain is well-controlled and she is doing well.  Hemoglobin had drifted to 8.3 from 10 but this is favored to be delusional as a repeat 6 hours later showed an increase in her hemoglobin of 1g. She was also given additional fluid on morning of POD#1 to help with mild acute on chronic kidney disease.  On postop day 1, she is comfortable with and stable for discharge home.  Postoperative expectations were reviewed.  All of her questions were answered.  She expressed understanding and agreement with the plan.    Allergies as of 02/03/2024       Reactions   Acetaminophen  Other (See Comments)   ELEVATED LIVER ENZYME   Percocet [oxycodone-acetaminophen ] Nausea Only        Medication List     TAKE these medications    acetaminophen  500 MG tablet Commonly known as: TYLENOL  Take 1 tablet (500 mg total) by mouth every 6 (six) hours.   ALPRAZolam  0.5 MG  tablet Commonly known as: XANAX  Take 0.5 mg by mouth 3 (three) times daily as needed for anxiety.   BD Pen Needle Nano 2nd Gen 32G X 4 MM Misc Generic drug: Insulin  Pen Needle Use with insulin  twice daily and Trulicity once weekly  DX E11.8 and Z79.4   docusate sodium 100 MG capsule Commonly known as: COLACE Take 2 capsules (200 mg total) by mouth 2 (two) times daily as needed for mild constipation.   gabapentin 300 MG capsule Commonly known as: NEURONTIN Take 300 mg by mouth at bedtime.   glipiZIDE 10 MG 24 hr tablet Commonly known as: GLUCOTROL XL Take 10 mg by mouth daily.   ibuprofen  600 MG tablet Commonly known as: ADVIL  Take 1 tablet (600 mg total) by mouth every 6 (six) hours as needed for mild pain (pain score 1-3).   lisinopril-hydrochlorothiazide 20-12.5 MG tablet Commonly known as: ZESTORETIC Take 1 tablet by mouth daily.   metoprolol  succinate 50 MG 24 hr tablet Commonly known as: TOPROL -XL Take 50 mg by mouth daily.   NovoLOG  Mix 70/30 FlexPen (70-30) 100 UNIT/ML FlexPen Generic drug: insulin  aspart protamine - aspart Inject 12 Units into the skin daily with breakfast.   omeprazole 20 MG capsule Commonly known as: PRILOSEC Take 20 mg by mouth 2 (two) times daily before a meal.   OneTouch Verio w/Device Kit by Does not apply route.   sertraline  100 MG tablet Commonly known as: ZOLOFT  Take 100 mg by mouth daily.   simvastatin  40 MG tablet Commonly known as: ZOCOR  Take 40 mg by  mouth daily.   traMADol 50 MG tablet Commonly known as: Ultram Take 1 tablet (50 mg total) by mouth every 6 (six) hours as needed for up to 5 days (postop pain not controlled with tylenol  first).          Follow-up Information     Teresa Lonni HERO, MD Follow up on 02/23/2024.   Specialties: General Surgery, Colon and Rectal Surgery Why: Please arrive by 8:45 am Contact information: [address] [place] [address] [phone]                 Lonni HERO. Teresa, M.D. [facility]

## 2024-03-31 ENCOUNTER — Encounter: Payer: Self-pay | Admitting: Gastroenterology

## 2024-05-21 ENCOUNTER — Encounter: Payer: Self-pay | Admitting: Gastroenterology

## 2024-05-21 ENCOUNTER — Ambulatory Visit: Admitting: Gastroenterology

## 2024-05-21 ENCOUNTER — Other Ambulatory Visit

## 2024-05-21 VITALS — HR 54 | Ht 65.0 in | Wt 159.0 lb

## 2024-05-21 DIAGNOSIS — K529 Noninfective gastroenteritis and colitis, unspecified: Secondary | ICD-10-CM | POA: Diagnosis not present

## 2024-05-21 DIAGNOSIS — K7469 Other cirrhosis of liver: Secondary | ICD-10-CM

## 2024-05-21 DIAGNOSIS — K7460 Unspecified cirrhosis of liver: Secondary | ICD-10-CM

## 2024-05-21 DIAGNOSIS — D509 Iron deficiency anemia, unspecified: Secondary | ICD-10-CM | POA: Diagnosis not present

## 2024-05-21 DIAGNOSIS — D500 Iron deficiency anemia secondary to blood loss (chronic): Secondary | ICD-10-CM

## 2024-05-21 LAB — COMPREHENSIVE METABOLIC PANEL WITH GFR
ALT: 10 U/L (ref 0–35)
AST: 19 U/L (ref 0–37)
Albumin: 3.7 g/dL (ref 3.5–5.2)
Alkaline Phosphatase: 131 U/L — ABNORMAL HIGH (ref 39–117)
BUN: 18 mg/dL (ref 6–23)
CO2: 31 meq/L (ref 19–32)
Calcium: 8.6 mg/dL (ref 8.4–10.5)
Chloride: 102 meq/L (ref 96–112)
Creatinine, Ser: 1.17 mg/dL (ref 0.40–1.20)
GFR: 43.25 mL/min — ABNORMAL LOW (ref >60.00)
Glucose, Bld: 137 mg/dL — ABNORMAL HIGH (ref 70–99)
Potassium: 3.4 meq/L — ABNORMAL LOW (ref 3.5–5.1)
Sodium: 139 meq/L (ref 135–145)
Total Bilirubin: 0.5 mg/dL (ref 0.2–1.2)
Total Protein: 6.6 g/dL (ref 6.0–8.3)

## 2024-05-21 LAB — CBC WITH DIFFERENTIAL/PLATELET
Basophils Absolute: 0 K/uL (ref 0.0–0.1)
Basophils Relative: 0.3 % (ref 0.0–3.0)
Eosinophils Absolute: 0.1 K/uL (ref 0.0–0.7)
Eosinophils Relative: 1.1 % (ref 0.0–5.0)
HCT: 27.7 % — ABNORMAL LOW (ref 36.0–46.0)
Hemoglobin: 8.8 g/dL — ABNORMAL LOW (ref 12.0–15.0)
Lymphocytes Relative: 25.4 % (ref 12.0–46.0)
Lymphs Abs: 1.3 K/uL (ref 0.7–4.0)
MCHC: 31.7 g/dL (ref 30.0–36.0)
MCV: 79.7 fl (ref 78.0–100.0)
Monocytes Absolute: 0.4 K/uL (ref 0.1–1.0)
Monocytes Relative: 7.2 % (ref 3.0–12.0)
Neutro Abs: 3.4 K/uL (ref 1.4–7.7)
Neutrophils Relative %: 66 % (ref 43.0–77.0)
Platelets: 109 K/uL — ABNORMAL LOW (ref 150.0–400.0)
RBC: 3.48 Mil/uL — ABNORMAL LOW (ref 3.87–5.11)
RDW: 26.2 % — ABNORMAL HIGH (ref 11.5–15.5)
WBC: 5.2 K/uL (ref 4.0–10.5)

## 2024-05-21 LAB — PROTIME-INR
INR: 1.2 ratio — ABNORMAL HIGH (ref 0.8–1.0)
Prothrombin Time: 12.6 s (ref 9.6–13.1)

## 2024-05-21 LAB — IBC + FERRITIN
Ferritin: 343.7 ng/mL — ABNORMAL HIGH (ref 10.0–291.0)
Iron: 379 ug/dL — ABNORMAL HIGH (ref 42–145)
Saturation Ratios: 106.2 % — ABNORMAL HIGH (ref 20.0–50.0)
TIBC: 357 ug/dL (ref 250.0–450.0)
Transferrin: 255 mg/dL (ref 212.0–360.0)

## 2024-05-21 NOTE — Progress Notes (Unsigned)
 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    She has been followed by Dr. Girard for the last 15 years for iron deficiency anemia and gets iron infusions as needed.  Discussed the use of AI scribe software for clinical note transcription with the patient, who gave verbal consent to proceed.  he has a 15-year history of chronic anemia, managed with regular blood transfusions and iron infusions. Her last hemoglobin check on April 14, 2024, showed a level of 6.2. Despite these interventions, she continues to experience weakness, fatigue, and dizziness, which she attributes to frequent blood draws and treatments for her anemia.  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 Her last endoscopy and colonoscopy in July 2024 noted bleeding, which was treated. Currently, there is no visible bleeding, but she continues to experience weakness and fatigue.  In March 2024, she was diagnosed with cirrhosis following a CT scan that also revealed gallstones. Her gallbladder was removed in August 2025. She denies any family history of cirrhosis and reports no alcohol consumption. She was previously diagnosed with fatty liver.  She experiences diarrhea following her gallbladder surgery, occurring frequently, including at night. She is diabetic and manages her condition with insulin  injections. Her dietary habits include avoiding salt and preferring frozen over canned foods to manage sodium intake. She  occasionally consumes coffee but is mindful of its effects on her gastrointestinal symptoms.   PREVIOUS GI WORKUP    EGD/Colonoscopy with [facility] 02/2023 with AVMS in ascending and transverse colon. 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  US  pelvis transabdominal/transvaginal - 4 x 2.8 x 2.6 cm right adnexal cystic mass at least mildly complex. Correlate with clinical laboratory findings. Consider Gyn referral. Multiple calcified uterine fibroids. CT Abd/pelvis 10/18/2022 - No evidence of acute intra-abdominal abnormality to explain the patient's pain. Cirrhotic morphology of the liver with splenomegaly possibly related to portal hypertension. Cholelithiasis. 3.9 cm low-density lesion of the right adnexa. Recommendation: Benign appearing adnexal cyst of greater than 3cm. For a late postmenopausal patient (>39yrs) recommend follow up ultrasound for further characterization. [employer]- Sept 2013). Moderate right inguinal hernia.  Colonoscopy 10/2018 ([facility]) - diverticulosis in descending and sigmoid colon, multiple small and large AVMs in the right colon and cecum, treated with APC.  Colonoscopy 12/2017 ([facility]) - Multiple colonic angioectasias. No specimens collected. No repeat colon recommended due to age   Past Medical History:  Diagnosis Date   Anemia    Anxiety and depression    Arthritis    back, arms (03/13/2018)   Chronic kidney disease    Chronic lower back pain    Cirrhosis (HCC)    Degeneration of uterine fibroid 2024   Needs yearly US  or MRI   Gallstones    GERD (gastroesophageal reflux disease)    Hyperlipemia    Hypertension    Iron deficiency anemia    I'm getting iron infusions (03/13/2018)   Myocardial infarction (HCC) ~ 1996   little one   Pneumonia ~ 1998 X 1   Right ovarian  cyst    Needs yearly US  or MRI   Squamous cell carcinoma of face    forehead   Type II diabetes mellitus (HCC)     Past Surgical History:  Procedure Laterality Date   BUNIONECTOMY  WITH HAMMERTOE RECONSTRUCTION Left    CATARACT EXTRACTION, BILATERAL Bilateral    CHOLECYSTECTOMY N/A 02/02/2024   Procedure: LAPAROSCOPIC CHOLECYSTECTOMY;  Surgeon: Teresa Lonni HERO, MD;  Location: [facility];  Service: General;  Laterality: N/A;  ICG CHOLANGIOGRAPHY   COLONOSCOPY     DILATION AND CURETTAGE OF UTERUS     shortly after menopause (lat4 1900s, maybe for spotting)   ESOPHAGOGASTRODUODENOSCOPY     EYE SURGERY Bilateral    after cataract OR   FRACTURE SURGERY     LIVER BIOPSY N/A 02/02/2024   Procedure: BIOPSY, LIVER;  Surgeon: Teresa Lonni HERO, MD;  Location: [facility];  Service: General;  Laterality: N/A;   PERCUTANEOUS PINNING PHALANX FRACTURE OF HAND Right 2017   took pins out after ~ 6 wks   TONSILLECTOMY     TUMOR EXCISION  1970s X 2   off my back   TUMOR EXCISION Left    jaw    Current Outpatient Medications  Medication Sig Dispense Refill   acetaminophen  (TYLENOL ) 500 MG tablet Take 1 tablet (500 mg total) by mouth every 6 (six) hours.     ALPRAZolam  (XANAX ) 0.5 MG tablet Take 0.5 mg by mouth 3 (three) times daily as needed for anxiety.     Blood Glucose Monitoring Suppl (ONETOUCH VERIO) w/Device KIT by Does not apply route.     Darbepoetin Alfa-Albumin (ARANESP IJ) Inject as directed. Prn when Hgb is low     docusate sodium  (COLACE) 100 MG capsule Take 2 capsules (200 mg total) by mouth 2 (two) times daily as needed for mild constipation.     gabapentin  (NEURONTIN ) 300 MG capsule Take 300 mg by mouth at bedtime.     glipiZIDE  (GLUCOTROL  XL) 10 MG 24 hr tablet Take 10 mg by mouth daily.     ibuprofen  (ADVIL ) 600 MG tablet Take 1 tablet (600 mg total) by mouth every 6 (six) hours as needed for mild pain (pain score 1-3).     Insulin  Pen Needle (BD PEN NEEDLE NANO 2ND GEN) 32G X 4 MM MISC Use with insulin  twice daily and Trulicity once weekly  DX E11.8 and Z79.4     lisinopril -hydrochlorothiazide  (PRINZIDE ,ZESTORETIC ) 20-12.5 MG tablet Take 1 tablet by  mouth daily.  0   metoprolol  succinate (TOPROL -XL) 50 MG 24 hr tablet Take 50 mg by mouth daily.     NOVOLOG  MIX 70/30 FLEXPEN (70-30) 100 UNIT/ML FlexPen Inject 12 Units into the skin daily with breakfast.     omeprazole (PRILOSEC) 20 MG capsule Take 20 mg by mouth 2 (two) times daily before a meal.     sertraline  (ZOLOFT ) 100 MG tablet Take 100 mg by mouth daily.       simvastatin  (ZOCOR ) 40 MG tablet Take 40 mg by mouth daily.      No current facility-administered medications for this visit.    Allergies as of 05/21/2024 - Review Complete 05/21/2024  Allergen Reaction Noted   Acetaminophen  Other (See Comments) 04/04/2015   Percocet [oxycodone -acetaminophen ] Nausea Only 06/19/2016   Zofran  [ondansetron ] Other (See Comments) 05/21/2024    Family History  Problem Relation Age of Onset   Stroke Mother    Diabetes Mother    Heart disease Mother    Stroke Father  Heart disease Brother    Stroke Brother    Heart disease Brother     Social History   Socioeconomic History   Marital status: Widowed    Spouse name: Not on file   Number of children: 1   Years of education: Not on file   Highest education level: Not on file  Occupational History   Occupation: retired  Tobacco Use   Smoking status: Never   Smokeless tobacco: Never  Vaping Use   Vaping status: Never Used  Substance and Sexual Activity   Alcohol use: Not Currently   Drug use: Never   Sexual activity: Not Currently  Other Topics Concern   Not on file  Social History Narrative   Not on file   Social Drivers of Health   Financial Resource Strain: Low Risk  (10/15/2023)   Received from [facility]   Overall Financial Resource Strain (CARDIA)    Difficulty of Paying Living Expenses: Not hard at all  Food Insecurity: No Food Insecurity (02/02/2024)   Hunger Vital Sign    Worried About Running Out of Food in the Last Year: Never true    Ran Out of Food in the Last Year: Never true  Transportation Needs: No  Transportation Needs (02/02/2024)   PRAPARE - Administrator, Civil Service (Medical): No    Lack of Transportation (Non-Medical): No  Physical Activity: Insufficiently Active (07/07/2023)   Received from [facility]   Exercise Vital Sign    On average, how many days per week do you engage in moderate to strenuous exercise (like a brisk walk)?: 1 day    On average, how many minutes do you engage in exercise at this level?: 10 min  Stress: No Stress Concern Present (07/07/2023)   Received from [facility] - Occupational Stress Questionnaire    Feeling of Stress : Only a little  Social Connections: Moderately Integrated (02/02/2024)   Social Connection and Isolation Panel    Frequency of Communication with Friends and Family: More than three times a week    Frequency of Social Gatherings with Friends and Family: Twice a week    Attends Religious Services: More than 4 times per year    Active Member of [employer] or Organizations: Yes    Attends Banker Meetings: 1 to 4 times per year    Marital Status: Widowed  Intimate Partner Violence: Not At Risk (02/02/2024)   Humiliation, Afraid, Rape, and Kick questionnaire    Fear of Current or Ex-Partner: No    Emotionally Abused: No    Physically Abused: No    Sexually Abused: No    Review of Systems:    Constitutional: No weight loss, fever, chills, weakness or fatigue HEENT: Eyes: No change in vision               Ears, Nose, Throat:  No change in hearing or congestion Skin: No rash or itching Cardiovascular: No chest pain, chest pressure or palpitations   Respiratory: No SOB or cough Gastrointestinal: See HPI and otherwise negative Genitourinary: No dysuria or change in urinary frequency Neurological: No headache, dizziness or syncope Musculoskeletal: No new muscle or joint pain Hematologic: No bleeding or bruising Psychiatric: No history of depression or anxiety     Physical Exam:  Vital signs: Pulse (!) 54   Ht 5' 5 (1.651 m)   Wt 159 lb (72.1 kg)   BMI 26.46 kg/m   Constitutional: NAD,  alert and cooperative Head:  Normocephalic and atraumatic. Eyes:   PEERL, EOMI. No icterus. Conjunctiva pink. Respiratory: Respirations even and unlabored. Lungs clear to auscultation bilaterally.   No wheezes, crackles, or rhonchi.  Cardiovascular:  Regular rate and rhythm. No peripheral edema, cyanosis or pallor.  Gastrointestinal:  Soft, nondistended, nontender. No rebound or guarding. Normal bowel sounds. No appreciable masses or hepatomegaly. Rectal:  Declines Msk:  Symmetrical without gross deformities. Without edema, no deformity or joint abnormality.  Neurologic:  Alert and  oriented x4;  grossly normal neurologically.  Skin:   Dry and intact without significant lesions or rashes. Psychiatric: Oriented to person, place and time. Demonstrates good judgement and reason without abnormal affect or behaviors.  Physical Exam    RELEVANT LABS AND IMAGING: CBC    Component Value Date/Time   WBC 5.2 05/21/2024 1455   RBC 3.48 (L) 05/21/2024 1455   HGB 8.8 (L) 05/21/2024 1455   HCT 27.7 (L) 05/21/2024 1455   PLT 109.0 (L) 05/21/2024 1455   MCV 79.7 05/21/2024 1455   MCH 27.5 02/03/2024 0513   MCHC 31.7 05/21/2024 1455   RDW 26.2 (H) 05/21/2024 1455   LYMPHSABS 1.3 05/21/2024 1455   MONOABS 0.4 05/21/2024 1455   EOSABS 0.1 05/21/2024 1455   BASOSABS 0.0 05/21/2024 1455    CMP     Component Value Date/Time   NA 139 05/21/2024 1455   K 3.4 (L) 05/21/2024 1455   CL 102 05/21/2024 1455   CO2 31 05/21/2024 1455   GLUCOSE 137 (H) 05/21/2024 1455   BUN 18 05/21/2024 1455   CREATININE 1.17 05/21/2024 1455   CALCIUM 8.6 05/21/2024 1455   PROT 6.6 05/21/2024 1455   ALBUMIN 3.7 05/21/2024 1455   AST 19 05/21/2024 1455   ALT 10 05/21/2024 1455   ALKPHOS 131 (H) 05/21/2024 1455   BILITOT 0.5 05/21/2024 1455   GFRNONAA 37 (L) 02/03/2024 0513   GFRAA  36 (L) 03/15/2018 0505     Assessment/Plan:    Iron deficiency anemia History of AVMs EGD/Colonoscopy 02/2023 with GAP for IDA with AVMs in ascending/transverse colon. 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  Extensive history of multiple EGDs/colonoscopies as well as VCE for history of anemia and AVMs.  Hgb 04/14/2024 6.2, MCV 75.1. regular infusions for IDA with novant. No overt bleeding at this time.  Patient feels she knows when her blood is low and is doing well through regular infusions at this time. - Recheck hemoglobin and iron studies - Continue regular iron infusions - If overt bleeding or worsening anemia can consider repeating EGD/colonoscopy - Recommend pantoprazole  40 mg twice daily - Consider octreotide possibly in the future  Cirrhosis Dx 10/2022 via CTAP showing cirrhosis with splenomegaly/portal hypertension.  No previous workup, no history of alcohol use, previously told she has fatty liver but no detailed imaging since 2015.  EGD with GAVE 2024 but no varices. Last LFTs July 2025 were normal. Patient and daughter adamantly declined extensive blood work today.  They were advised it is important to look into things such as hepatitis, ASMA, AMA, AFP and other diagnostic lab work.  After joint decision-making was made they were agreeable to CBC, CMP, PT/INR as it was only 3 vials of blood versus 5.  They are agreeable to come back at a different time for blood work when they are ready - Extensive education on cirrhosis - RUQ ultrasound to screen for [facility] - CBC/CMP/PT/INR to evaluate MELD score - 2 g sodium diet  Chronic diarrhea Chronic diarrhea s/p cholecystectomy.  Recent colonoscopy in 2024 with AVMs.  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  Advised to be aware not to have constipation as that can complicate her cirrhosis  Accepted by Dr. Stacia for transfer of care  [facility], [place] [facility] 05/22/2024, 12:35  PM  Cc: Teresa Lonni HERO, MD

## 2024-05-21 NOTE — Patient Instructions (Signed)
 Your provider has requested that you go to the basement level for lab work before leaving today. Press B on the elevator. The lab is located at the first door on the left as you exit the elevator.  Please call [facility] to schedule a RUQ abdominal ultrasound.  _______________________________________________________  If your blood pressure at your visit was 140/90 or greater, please contact your primary care physician to follow up on this.  _______________________________________________________  If you are age 83 or older, your body mass index should be between 23-30. Your Body mass index is 26.46 kg/m. If this is out of the aforementioned range listed, please consider follow up with your Primary Care Provider.  If you are age 31 or younger, your body mass index should be between 19-25. Your Body mass index is 26.46 kg/m. If this is out of the aformentioned range listed, please consider follow up with your Primary Care Provider.   ________________________________________________________  The [facility] providers would like to encourage you to use MYCHART to communicate with providers for non-urgent requests or questions.  Due to long hold times on the telephone, sending your provider a message by [facility] may be a faster and more efficient way to get a response.  Please allow 48 business hours for a response.  Please remember that this is for non-urgent requests.  _______________________________________________________  [facility] is using a team-based approach to care.  Your team is made up of your doctor and two to three APPS. Our APPS (Nurse Practitioners and Physician Assistants) work with your physician to ensure care continuity for you. They are fully qualified to address your health concerns and develop a treatment plan. They communicate directly with your gastroenterologist to care for you. Seeing the Advanced Practice Practitioners on your physician's team can  help you by facilitating care more promptly, often allowing for earlier appointments, access to diagnostic testing, procedures, and other specialty referrals.

## 2024-05-25 ENCOUNTER — Ambulatory Visit: Payer: Self-pay | Admitting: Gastroenterology

## 2024-05-25 DIAGNOSIS — K7469 Other cirrhosis of liver: Secondary | ICD-10-CM

## 2024-05-26 MED ORDER — COLESTIPOL HCL 1 G PO TABS: 1.0000 g | ORAL_TABLET | Freq: Two times a day (BID) | ORAL | 2 refills | Status: AC

## 2024-05-26 NOTE — Telephone Encounter (Signed)
 Lolita returning call. Please call her at [phone]

## 2024-05-30 NOTE — Progress Notes (Signed)
 Agree with the assessment and plan as outlined by Boone Master, PA-C.

## 2024-06-03 ENCOUNTER — Ambulatory Visit
Admission: RE | Admit: 2024-06-03 | Discharge: 2024-06-03 | Disposition: A | Discharge status: Home / Self Care | Source: Ambulatory Visit | Attending: Gastroenterology | Admitting: Gastroenterology

## 2024-06-03 DIAGNOSIS — K7469 Other cirrhosis of liver: Secondary | ICD-10-CM

## 2024-06-14 ENCOUNTER — Ambulatory Visit
Admission: RE | Admit: 2024-06-14 | Discharge: 2024-06-14 | Disposition: A | Source: Ambulatory Visit | Attending: Gastroenterology
